# Patient Record
Sex: Male | Born: 1940 | Race: White | Hispanic: No | Marital: Married | Smoking: Never smoker
Health system: Southern US, Community
[De-identification: ages and names within clinical notes are randomized; demographics above are authoritative.]

## PROBLEM LIST (undated history)

## (undated) HISTORY — PX: HERNIA REPAIR: SHX51

---

## 2002-08-04 ENCOUNTER — Encounter (INDEPENDENT_AMBULATORY_CARE_PROVIDER_SITE_OTHER): Payer: Self-pay

## 2002-08-04 ENCOUNTER — Ambulatory Visit (HOSPITAL_COMMUNITY): Admission: RE | Admit: 2002-08-04 | Discharge: 2002-08-04 | Payer: Self-pay | Admitting: *Deleted

## 2006-07-12 ENCOUNTER — Encounter: Admission: RE | Admit: 2006-07-12 | Discharge: 2006-07-12 | Payer: Self-pay | Admitting: Internal Medicine

## 2006-07-12 IMAGING — US US SCROTUM
1 series · 13 of 25 positions shown · non-contrast
Comparison: none

CLINICAL DATA: Question left testicular mass on physical exam. 
 SCROTAL ULTRASOUND:
TECHNIQUE: Complete ultrasound examination of the testicles, epididymis, and other scrotal structures was performed.

[Series 1: us scrotum · 0.09mm/px · 13 of 57 slices shown]
[im 1/57]
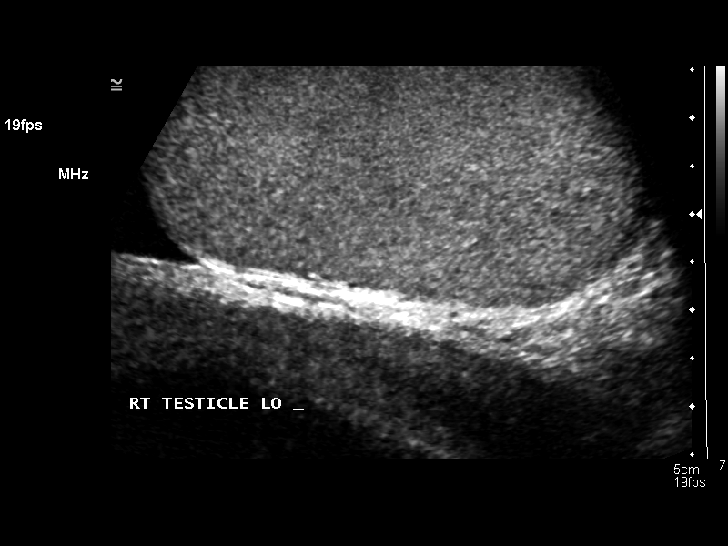
[im 5/57]
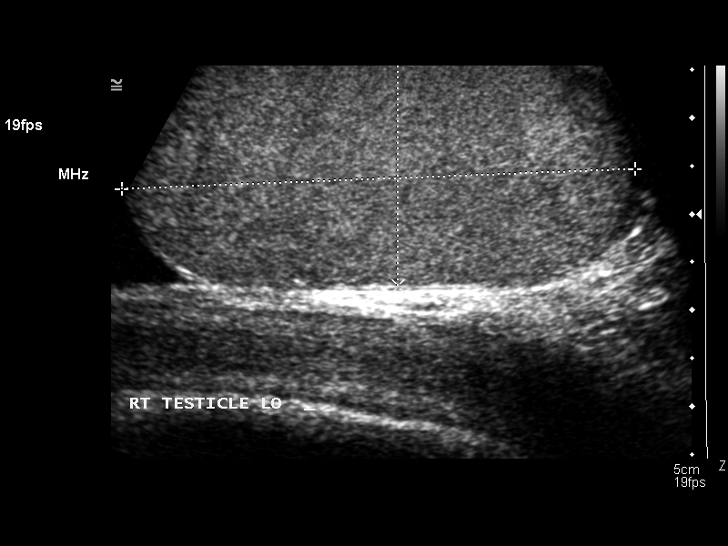
[im 10/57]
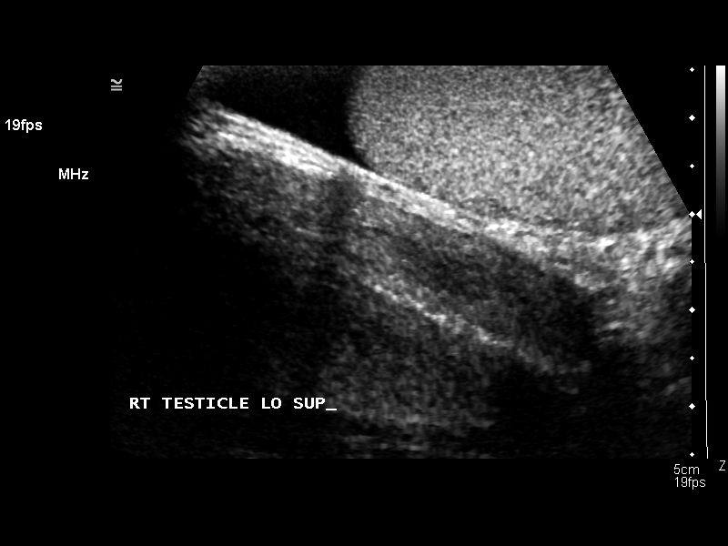
[im 15/57]
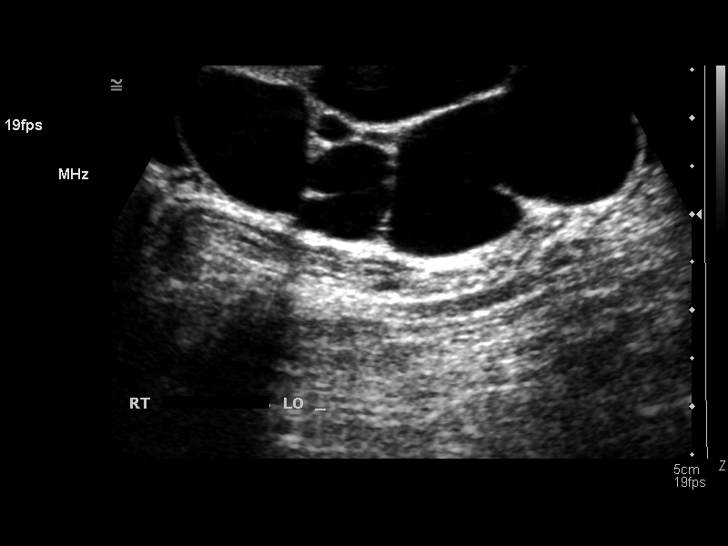
[im 19/57]
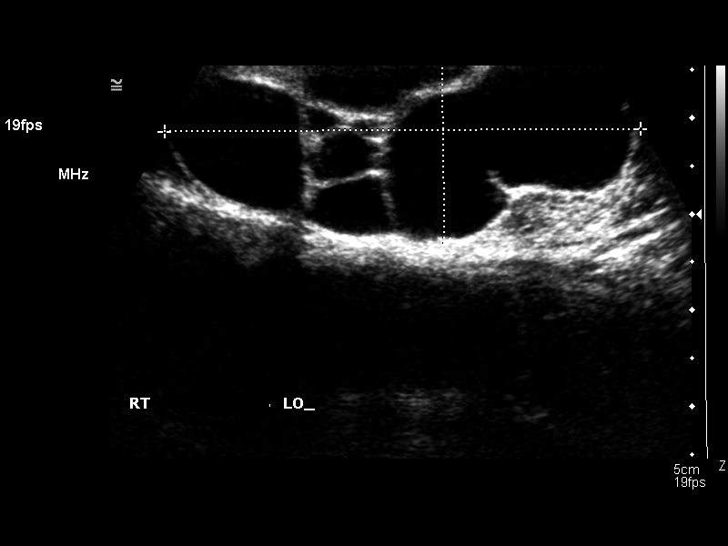
[im 24/57]
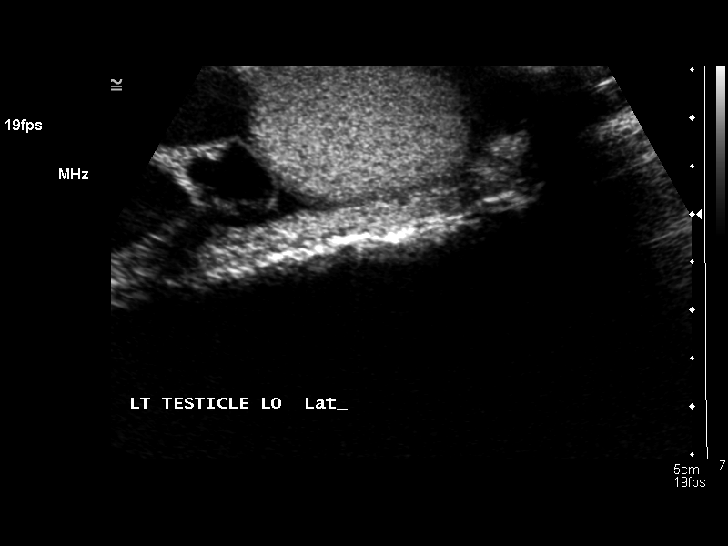
[im 29/57]
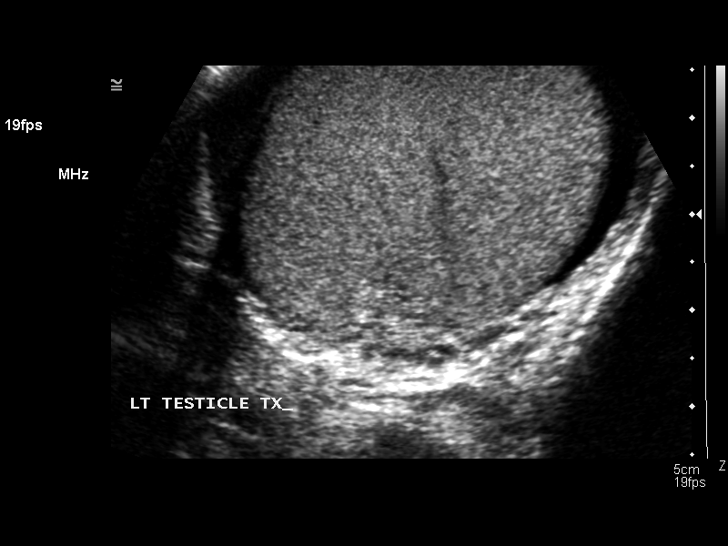
[im 33/57]
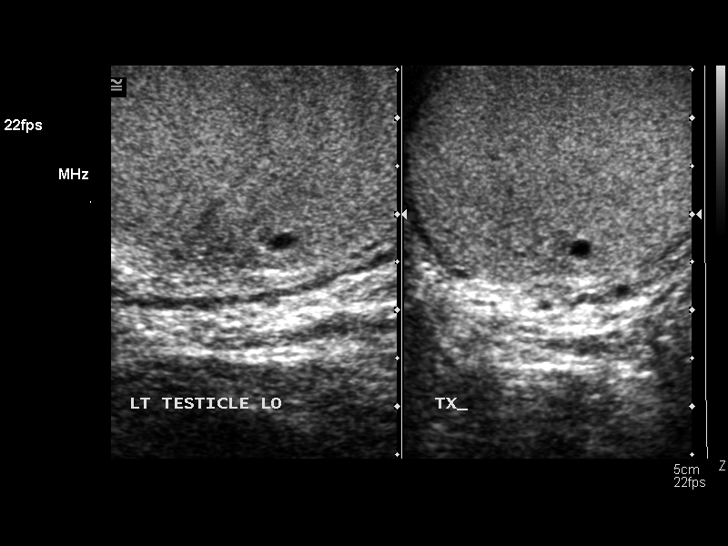
[im 38/57]
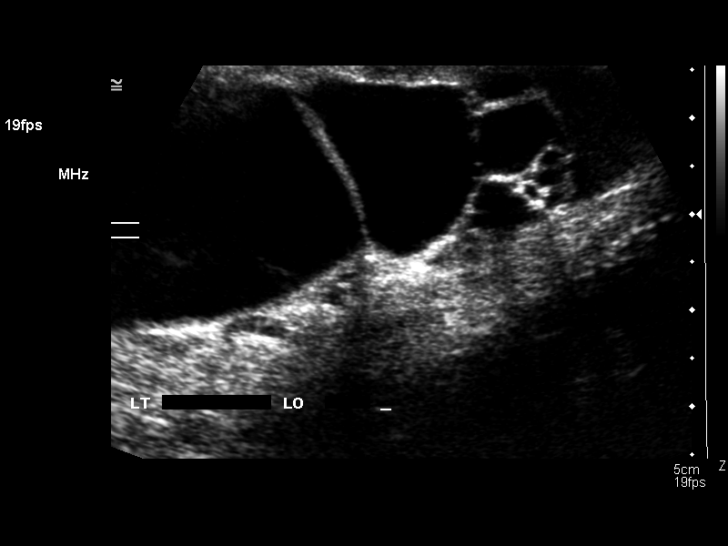
[im 43/57]
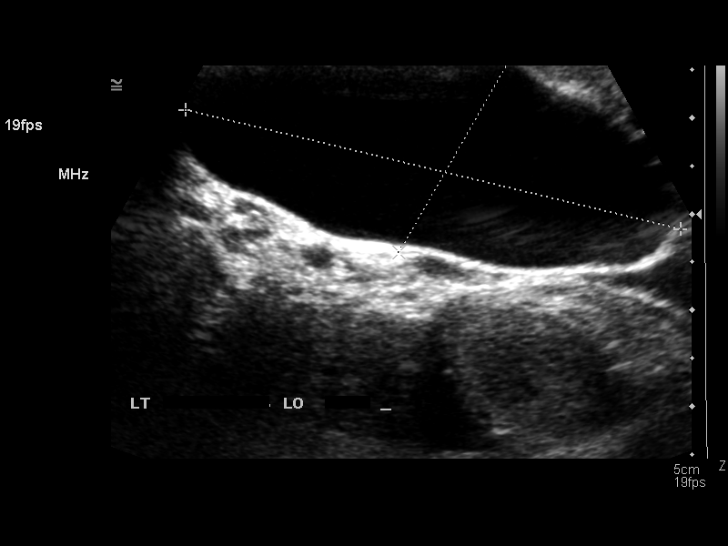
[im 47/57]
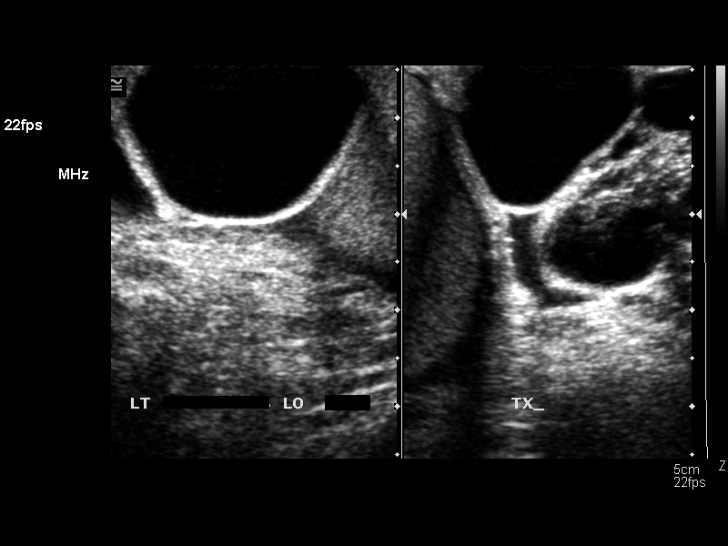
[im 52/57]
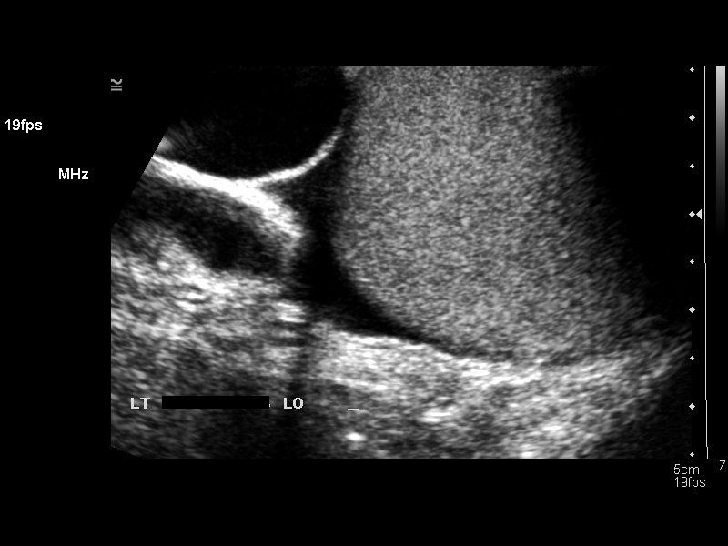
[im 57/57]
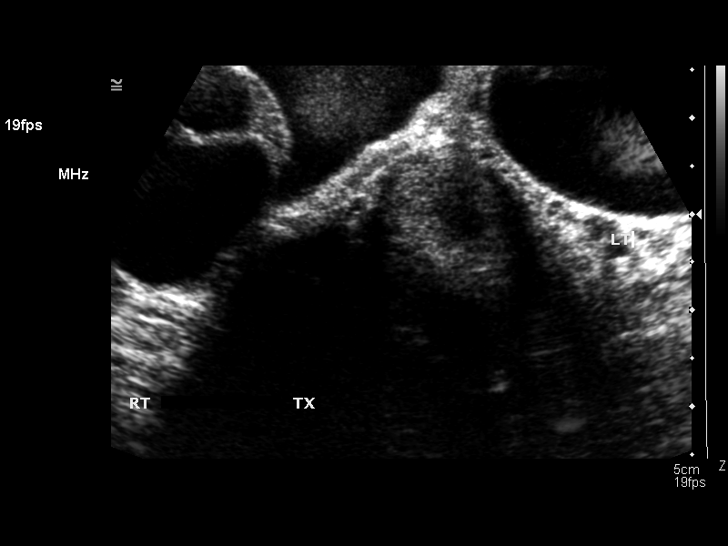

[13 of 25 positions shown; findings below may reference images not displayed]

FINDINGS: Bilateral testes are normal in size with the right measuring 5.3 cm long x 2.5 cm AP x 3.8 cm wide and the left 4.9 cm long x 2.3 cm AP x 3.7 cm wide with normal internal testicular color Doppler flow with arterial and venous wave forms.  At the posterior inferior left testis is benign-appearing cyst measuring 4 mm long x 2 mm AP x 2 mm wide.  Bilateral scrotal fluid is upper limits of normal with no significant hydroceles.  No varicocele seen.  Large benign-appearing multiloculated cysts/spermatoceles are seen at the bilateral epididymal heads the largest at the right entirety measuring 5 cm long x 2.1 cm AP x 2.8 cm wide with the largest single loculation at the left epididymal head measuring 5.3 cm long x 2.4 cm AP x 3.9 cm wide with adjacent smaller cystic locules measuring 2.6 x 1.8 x 1.9 and 3.8 x 2.2 x 2.2 cm.
IMPRESSION: 1.  4 mm benign-appearing left testicular cyst.
 2.  Prominent multicystic spermatoceles at the bilateral epididymal heads. 
 3.  Otherwise negative.

## 2008-09-29 ENCOUNTER — Encounter (INDEPENDENT_AMBULATORY_CARE_PROVIDER_SITE_OTHER): Payer: Self-pay | Admitting: *Deleted

## 2008-09-29 ENCOUNTER — Ambulatory Visit (HOSPITAL_COMMUNITY): Admission: RE | Admit: 2008-09-29 | Discharge: 2008-09-29 | Payer: Self-pay | Admitting: *Deleted

## 2010-11-01 NOTE — Op Note (Signed)
NAMEMYRAN, ARCIA NO.:  [id number]   MEDICAL RECORD NO.:  [id number]          PATIENT TYPE:  AMB   LOCATION:  ENDO                         FACILITY:  [facility]   PHYSICIAN:  Georgiana Spinner, M.D.    DATE OF BIRTH:  Jul 28, 1940   DATE OF PROCEDURE:  DATE OF DISCHARGE:                               OPERATIVE REPORT   PROCEDURE:  Upper endoscopy.   INDICATIONS:  GERD with history of Barrett's endoscopically, but not  biopsy proven.   ANESTHESIA:  Fentanyl 100 mcg, Versed 10 mg.   DESCRIPTION OF PROCEDURE:  With the patient mildly sedated in the left  lateral decubitus position, the Pentax videoscopic endoscope was  inserted in the mouth and passed under direct vision through the  esophagus which appeared normal until we reached the distal esophagus  and it could not be clearly seen since it would not open, but there were  areas that appeared adenomatous, so I elected to biopsy them.  We  entered into the stomach.  The fundus, body, antrum, duodenal bulb and  second portion of the duodenum were visualized.  From this point, the  endoscope was slowly withdrawn taking circumferential views of duodenal  mucosa until the endoscope had been pulled back into the stomach and  placed in retroflexion to view the stomach from below.  The endoscope  was straightened and withdrawn taking circumferential views remaining  gastric and esophageal mucosa.  The patient's vital signs and pulse  oximeter remained stable.  The patient tolerated the procedure well  without apparent complication.   FINDINGS:  Question of Barrett's esophagus, biopsied.  Await biopsy  report.  The patient will call me for results and follow-up with me as  an outpatient.  Proceed to colonoscopy as planned.           ______________________________  Georgiana Spinner, M.D.     GMO/MEDQ  D:  09/29/2008  T:  09/29/2008  Job:  [id number]

## 2010-11-01 NOTE — Op Note (Signed)
NAMEJAYLN, MADEIRA NO.:  [id number]   MEDICAL RECORD NO.:  [id number]          PATIENT TYPE:  AMB   LOCATION:  ENDO                         FACILITY:  [facility]   PHYSICIAN:  Georgiana Spinner, M.D.    DATE OF BIRTH:  Oct 30, 1940   DATE OF PROCEDURE:  DATE OF DISCHARGE:                               OPERATIVE REPORT   PROCEDURE:  Colonoscopy.   INDICATIONS:  Colon polyps.   ANESTHESIA:  None further given.   PROCEDURE:  With the patient mildly sedated in the left lateral  decubitus position, a rectal exam was performed which was unremarkable.  Subsequently, the Pentax videoscopic pediatric colonoscope was inserted  in the rectum and passed under direct vision with pressure applied to  reach the cecum, identified by ileocecal valve and appendiceal orifice,  both of which were photographed.  From this point, the colonoscope was  slowly withdrawn taking circumferential views of the colonic mucosa  stopping at the splenic flexure area where a polyp was seen,  photographed and removed using hot biopsy forceps technique on a setting  of 20/150 blended current.  We then withdrew all the way to the rectum  which appeared normal on direct and showed hemorrhoids on retroflexed  view.  The endoscope was straightened and withdrawn.  The patient's  vital signs and pulse oximeter remained stable.  The patient tolerated  the procedure well without apparent complications.   FINDINGS:  Internal hemorrhoids, polyp of splenic flexure.  Await biopsy  reports.  The patient will call me for results and follow-up with me as  an outpatient.           ______________________________  Georgiana Spinner, M.D.     GMO/MEDQ  D:  09/29/2008  T:  09/29/2008  Job:  [id number]

## 2010-11-04 NOTE — Op Note (Signed)
   NAME:  Jose Chapman, Jose Chapman                           ACCOUNT NO.:  [id number]   MEDICAL RECORD NO.:  [id number]                   PATIENT TYPE:  AMB   LOCATION:  ENDO                                 FACILITY:  [facility]   PHYSICIAN:  Georgiana Spinner, M.D.                 DATE OF BIRTH:  [birth year]   DATE OF PROCEDURE:  08/04/2002  DATE OF DISCHARGE:                                 OPERATIVE REPORT   PROCEDURE:  Colonoscopy.   INDICATIONS:  Rectal bleeding, probably due to hemorrhoids.   ANESTHESIA:  Demerol 25.   DESCRIPTION OF PROCEDURE:  With patient mildly sedated in the left lateral  decubitus position, the Olympus videoscopic colonoscope was inserted in the  rectum after normal rectal exam and passed under direct vision to the cecum,  identified by the ileocecal valve and appendiceal orifice, both of which  were photographed.  From this point, the colonoscope was slowly withdrawn,  taking circumferential views of the entire colonic mucosa, stopping only in  the rectum which appeared normal on direct and showed hemorrhoids on  retroflexed view.  The endoscope was straightened and withdrawn.  The  patient's vital signs and pulse oximeter remained stable.  The patient  tolerated the procedure well without apparent complications.   FINDINGS:  1. Internal hemorrhoids.  2. Otherwise, unremarkable exam.   PLAN:  1. See endoscopy note for further details.  2. Have patient follow up with me in five years or as needed.                                               Georgiana Spinner, M.D.    GMO/MEDQ  D:  08/04/2002  T:  08/04/2002  Job:  [id number]

## 2010-11-04 NOTE — Op Note (Signed)
   NAME:  Jose Chapman, Jose Chapman                           ACCOUNT NO.:  [id number]   MEDICAL RECORD NO.:  [id number]                   PATIENT TYPE:  AMB   LOCATION:  ENDO                                 FACILITY:  [facility]   PHYSICIAN:  Georgiana Spinner, M.D.                 DATE OF BIRTH:  15-Aug-1940   DATE OF PROCEDURE:  08/04/2002  DATE OF DISCHARGE:                                 OPERATIVE REPORT   PROCEDURE:  Upper endoscopy.   INDICATIONS:  GERD.   ANESTHESIA:  Demerol 50 mg, Versed 8 mg.   DESCRIPTION OF PROCEDURE:  With the patient mildly sedated in the left  lateral decubitus position, the Olympus videoscopic endoscope inserted in  the mouth, passed under direct vision through the esophagus, which appeared  grossly normal.  We entered into the stomach.  The fundus, body, antrum,  duodenal bulb, and second portion of the duodenum all appeared normal.  From  this point the endoscope was slowly withdrawn, taking circumferential views  of the entire duodenal mucosa until the endoscope had been pulled back into  the stomach, placed in retroflexion to view the stomach from below.  The  endoscope was then straightened and withdrawn, taking circumferential views  of the remaining gastric and esophageal mucosa, stopping only in the distal  esophagus above a hiatal hernia, where there appeared to be maybe a short  segment of Barrett's esophagus, but this may have been just inadequate  opening of the esophagus, but the area was biopsied.  The patient's vital  signs and pulse oximetry remained stable.  The patient tolerated the  procedure well and without apparent complications.   FINDINGS:  Question of short-segment Barrett's esophagus above a hiatal  hernia, biopsied.   PLAN:  Await biopsy report.  The patient will call me for results and follow  up with me as an outpatient.  Proceed to colonoscopy as planned.                                               Georgiana Spinner, M.D.    GMO/MEDQ   D:  08/04/2002  T:  08/04/2002  Job:  [id number]

## 2011-07-31 DIAGNOSIS — Z125 Encounter for screening for malignant neoplasm of prostate: Secondary | ICD-10-CM | POA: Diagnosis not present

## 2011-07-31 DIAGNOSIS — R5383 Other fatigue: Secondary | ICD-10-CM | POA: Diagnosis not present

## 2011-07-31 DIAGNOSIS — E7800 Pure hypercholesterolemia, unspecified: Secondary | ICD-10-CM | POA: Diagnosis not present

## 2011-07-31 DIAGNOSIS — R5381 Other malaise: Secondary | ICD-10-CM | POA: Diagnosis not present

## 2011-08-03 DIAGNOSIS — E7800 Pure hypercholesterolemia, unspecified: Secondary | ICD-10-CM | POA: Diagnosis not present

## 2011-08-03 DIAGNOSIS — R7989 Other specified abnormal findings of blood chemistry: Secondary | ICD-10-CM | POA: Diagnosis not present

## 2011-08-03 DIAGNOSIS — Z0000 Encounter for general adult medical examination without abnormal findings: Secondary | ICD-10-CM | POA: Diagnosis not present

## 2011-12-26 DIAGNOSIS — B353 Tinea pedis: Secondary | ICD-10-CM | POA: Diagnosis not present

## 2011-12-26 DIAGNOSIS — D239 Other benign neoplasm of skin, unspecified: Secondary | ICD-10-CM | POA: Diagnosis not present

## 2011-12-26 DIAGNOSIS — Z85828 Personal history of other malignant neoplasm of skin: Secondary | ICD-10-CM | POA: Diagnosis not present

## 2011-12-26 DIAGNOSIS — L821 Other seborrheic keratosis: Secondary | ICD-10-CM | POA: Diagnosis not present

## 2012-08-05 DIAGNOSIS — Z0000 Encounter for general adult medical examination without abnormal findings: Secondary | ICD-10-CM | POA: Diagnosis not present

## 2012-08-05 DIAGNOSIS — Z125 Encounter for screening for malignant neoplasm of prostate: Secondary | ICD-10-CM | POA: Diagnosis not present

## 2012-08-05 DIAGNOSIS — R7989 Other specified abnormal findings of blood chemistry: Secondary | ICD-10-CM | POA: Diagnosis not present

## 2012-08-05 DIAGNOSIS — E7800 Pure hypercholesterolemia, unspecified: Secondary | ICD-10-CM | POA: Diagnosis not present

## 2012-08-07 DIAGNOSIS — Z125 Encounter for screening for malignant neoplasm of prostate: Secondary | ICD-10-CM | POA: Diagnosis not present

## 2012-08-07 DIAGNOSIS — Z1331 Encounter for screening for depression: Secondary | ICD-10-CM | POA: Diagnosis not present

## 2013-01-02 DIAGNOSIS — L219 Seborrheic dermatitis, unspecified: Secondary | ICD-10-CM | POA: Diagnosis not present

## 2013-01-02 DIAGNOSIS — Z85828 Personal history of other malignant neoplasm of skin: Secondary | ICD-10-CM | POA: Diagnosis not present

## 2013-01-02 DIAGNOSIS — D239 Other benign neoplasm of skin, unspecified: Secondary | ICD-10-CM | POA: Diagnosis not present

## 2013-01-02 DIAGNOSIS — L570 Actinic keratosis: Secondary | ICD-10-CM | POA: Diagnosis not present

## 2013-06-13 DIAGNOSIS — R05 Cough: Secondary | ICD-10-CM | POA: Diagnosis not present

## 2013-06-13 DIAGNOSIS — J209 Acute bronchitis, unspecified: Secondary | ICD-10-CM | POA: Diagnosis not present

## 2013-09-05 DIAGNOSIS — E7800 Pure hypercholesterolemia, unspecified: Secondary | ICD-10-CM | POA: Diagnosis not present

## 2013-09-05 DIAGNOSIS — Z125 Encounter for screening for malignant neoplasm of prostate: Secondary | ICD-10-CM | POA: Diagnosis not present

## 2013-09-05 DIAGNOSIS — R7309 Other abnormal glucose: Secondary | ICD-10-CM | POA: Diagnosis not present

## 2013-09-11 DIAGNOSIS — Z0000 Encounter for general adult medical examination without abnormal findings: Secondary | ICD-10-CM | POA: Diagnosis not present

## 2013-09-11 DIAGNOSIS — Z1331 Encounter for screening for depression: Secondary | ICD-10-CM | POA: Diagnosis not present

## 2013-12-18 DIAGNOSIS — K2270 Barrett's esophagus without dysplasia: Secondary | ICD-10-CM | POA: Diagnosis not present

## 2013-12-18 DIAGNOSIS — Z86010 Personal history of colonic polyps: Secondary | ICD-10-CM | POA: Diagnosis not present

## 2013-12-18 DIAGNOSIS — Z09 Encounter for follow-up examination after completed treatment for conditions other than malignant neoplasm: Secondary | ICD-10-CM | POA: Diagnosis not present

## 2013-12-18 DIAGNOSIS — K449 Diaphragmatic hernia without obstruction or gangrene: Secondary | ICD-10-CM | POA: Diagnosis not present

## 2013-12-18 DIAGNOSIS — K222 Esophageal obstruction: Secondary | ICD-10-CM | POA: Diagnosis not present

## 2014-01-28 DIAGNOSIS — L570 Actinic keratosis: Secondary | ICD-10-CM | POA: Diagnosis not present

## 2014-01-28 DIAGNOSIS — I781 Nevus, non-neoplastic: Secondary | ICD-10-CM | POA: Diagnosis not present

## 2014-01-28 DIAGNOSIS — L219 Seborrheic dermatitis, unspecified: Secondary | ICD-10-CM | POA: Diagnosis not present

## 2014-01-28 DIAGNOSIS — D239 Other benign neoplasm of skin, unspecified: Secondary | ICD-10-CM | POA: Diagnosis not present

## 2014-01-28 DIAGNOSIS — Z85828 Personal history of other malignant neoplasm of skin: Secondary | ICD-10-CM | POA: Diagnosis not present

## 2014-01-28 DIAGNOSIS — D1779 Benign lipomatous neoplasm of other sites: Secondary | ICD-10-CM | POA: Diagnosis not present

## 2014-05-05 DIAGNOSIS — D2272 Melanocytic nevi of left lower limb, including hip: Secondary | ICD-10-CM | POA: Diagnosis not present

## 2014-09-17 DIAGNOSIS — Z125 Encounter for screening for malignant neoplasm of prostate: Secondary | ICD-10-CM | POA: Diagnosis not present

## 2014-09-17 DIAGNOSIS — E780 Pure hypercholesterolemia: Secondary | ICD-10-CM | POA: Diagnosis not present

## 2014-09-22 DIAGNOSIS — Z23 Encounter for immunization: Secondary | ICD-10-CM | POA: Diagnosis not present

## 2014-09-22 DIAGNOSIS — R7309 Other abnormal glucose: Secondary | ICD-10-CM | POA: Diagnosis not present

## 2014-09-22 DIAGNOSIS — Z0001 Encounter for general adult medical examination with abnormal findings: Secondary | ICD-10-CM | POA: Diagnosis not present

## 2015-03-02 DIAGNOSIS — D225 Melanocytic nevi of trunk: Secondary | ICD-10-CM | POA: Diagnosis not present

## 2015-03-02 DIAGNOSIS — D2272 Melanocytic nevi of left lower limb, including hip: Secondary | ICD-10-CM | POA: Diagnosis not present

## 2015-03-02 DIAGNOSIS — Z85828 Personal history of other malignant neoplasm of skin: Secondary | ICD-10-CM | POA: Diagnosis not present

## 2015-03-02 DIAGNOSIS — B353 Tinea pedis: Secondary | ICD-10-CM | POA: Diagnosis not present

## 2015-03-02 DIAGNOSIS — L219 Seborrheic dermatitis, unspecified: Secondary | ICD-10-CM | POA: Diagnosis not present

## 2015-03-02 DIAGNOSIS — L821 Other seborrheic keratosis: Secondary | ICD-10-CM | POA: Diagnosis not present

## 2015-03-02 DIAGNOSIS — D1721 Benign lipomatous neoplasm of skin and subcutaneous tissue of right arm: Secondary | ICD-10-CM | POA: Diagnosis not present

## 2015-03-02 DIAGNOSIS — L570 Actinic keratosis: Secondary | ICD-10-CM | POA: Diagnosis not present

## 2015-09-10 DIAGNOSIS — R0981 Nasal congestion: Secondary | ICD-10-CM | POA: Diagnosis not present

## 2015-09-10 DIAGNOSIS — J069 Acute upper respiratory infection, unspecified: Secondary | ICD-10-CM | POA: Diagnosis not present

## 2015-09-23 DIAGNOSIS — R7309 Other abnormal glucose: Secondary | ICD-10-CM | POA: Diagnosis not present

## 2015-09-23 DIAGNOSIS — Z125 Encounter for screening for malignant neoplasm of prostate: Secondary | ICD-10-CM | POA: Diagnosis not present

## 2015-09-23 DIAGNOSIS — Z0000 Encounter for general adult medical examination without abnormal findings: Secondary | ICD-10-CM | POA: Diagnosis not present

## 2015-09-23 DIAGNOSIS — Z0001 Encounter for general adult medical examination with abnormal findings: Secondary | ICD-10-CM | POA: Diagnosis not present

## 2015-09-23 DIAGNOSIS — Z7982 Long term (current) use of aspirin: Secondary | ICD-10-CM | POA: Diagnosis not present

## 2015-09-30 DIAGNOSIS — R7303 Prediabetes: Secondary | ICD-10-CM | POA: Diagnosis not present

## 2015-09-30 DIAGNOSIS — K649 Unspecified hemorrhoids: Secondary | ICD-10-CM | POA: Diagnosis not present

## 2015-09-30 DIAGNOSIS — G43109 Migraine with aura, not intractable, without status migrainosus: Secondary | ICD-10-CM | POA: Diagnosis not present

## 2015-09-30 DIAGNOSIS — Z85828 Personal history of other malignant neoplasm of skin: Secondary | ICD-10-CM | POA: Diagnosis not present

## 2016-03-02 DIAGNOSIS — D225 Melanocytic nevi of trunk: Secondary | ICD-10-CM | POA: Diagnosis not present

## 2016-03-02 DIAGNOSIS — L570 Actinic keratosis: Secondary | ICD-10-CM | POA: Diagnosis not present

## 2016-03-02 DIAGNOSIS — Z85828 Personal history of other malignant neoplasm of skin: Secondary | ICD-10-CM | POA: Diagnosis not present

## 2016-03-02 DIAGNOSIS — D2272 Melanocytic nevi of left lower limb, including hip: Secondary | ICD-10-CM | POA: Diagnosis not present

## 2016-03-02 DIAGNOSIS — L720 Epidermal cyst: Secondary | ICD-10-CM | POA: Diagnosis not present

## 2016-03-02 DIAGNOSIS — D1721 Benign lipomatous neoplasm of skin and subcutaneous tissue of right arm: Secondary | ICD-10-CM | POA: Diagnosis not present

## 2016-03-02 DIAGNOSIS — Z808 Family history of malignant neoplasm of other organs or systems: Secondary | ICD-10-CM | POA: Diagnosis not present

## 2016-03-13 DIAGNOSIS — Z6827 Body mass index (BMI) 27.0-27.9, adult: Secondary | ICD-10-CM | POA: Diagnosis not present

## 2016-03-13 DIAGNOSIS — M545 Low back pain: Secondary | ICD-10-CM | POA: Diagnosis not present

## 2016-03-13 DIAGNOSIS — Z23 Encounter for immunization: Secondary | ICD-10-CM | POA: Diagnosis not present

## 2016-03-13 DIAGNOSIS — M5126 Other intervertebral disc displacement, lumbar region: Secondary | ICD-10-CM | POA: Diagnosis not present

## 2016-05-16 DIAGNOSIS — M7712 Lateral epicondylitis, left elbow: Secondary | ICD-10-CM | POA: Diagnosis not present

## 2016-05-16 DIAGNOSIS — M25551 Pain in right hip: Secondary | ICD-10-CM | POA: Diagnosis not present

## 2016-05-22 ENCOUNTER — Ambulatory Visit
Admission: RE | Admit: 2016-05-22 | Discharge: 2016-05-22 | Disposition: A | Discharge status: Home / Self Care | Payer: Medicare Other | Source: Ambulatory Visit | Attending: Sports Medicine | Admitting: Sports Medicine

## 2016-05-22 ENCOUNTER — Encounter: Payer: Self-pay | Admitting: Sports Medicine

## 2016-05-22 ENCOUNTER — Ambulatory Visit (INDEPENDENT_AMBULATORY_CARE_PROVIDER_SITE_OTHER): Payer: Medicare Other | Admitting: Sports Medicine

## 2016-05-22 VITALS — BP 114/70 | Ht 74.0 in | Wt 205.0 lb

## 2016-05-22 DIAGNOSIS — M25551 Pain in right hip: Secondary | ICD-10-CM

## 2016-05-22 IMAGING — CR DG HIP (WITH OR WITHOUT PELVIS) 2-3V*R*
2 series · 2 of 2 positions shown · non-contrast
Comparison: None in PACs

CLINICAL DATA: Right hip pain for the past several weeks without
known injury

EXAM:
DG HIP (WITH OR WITHOUT PELVIS) 2-3V RIGHT

[w pelvis]
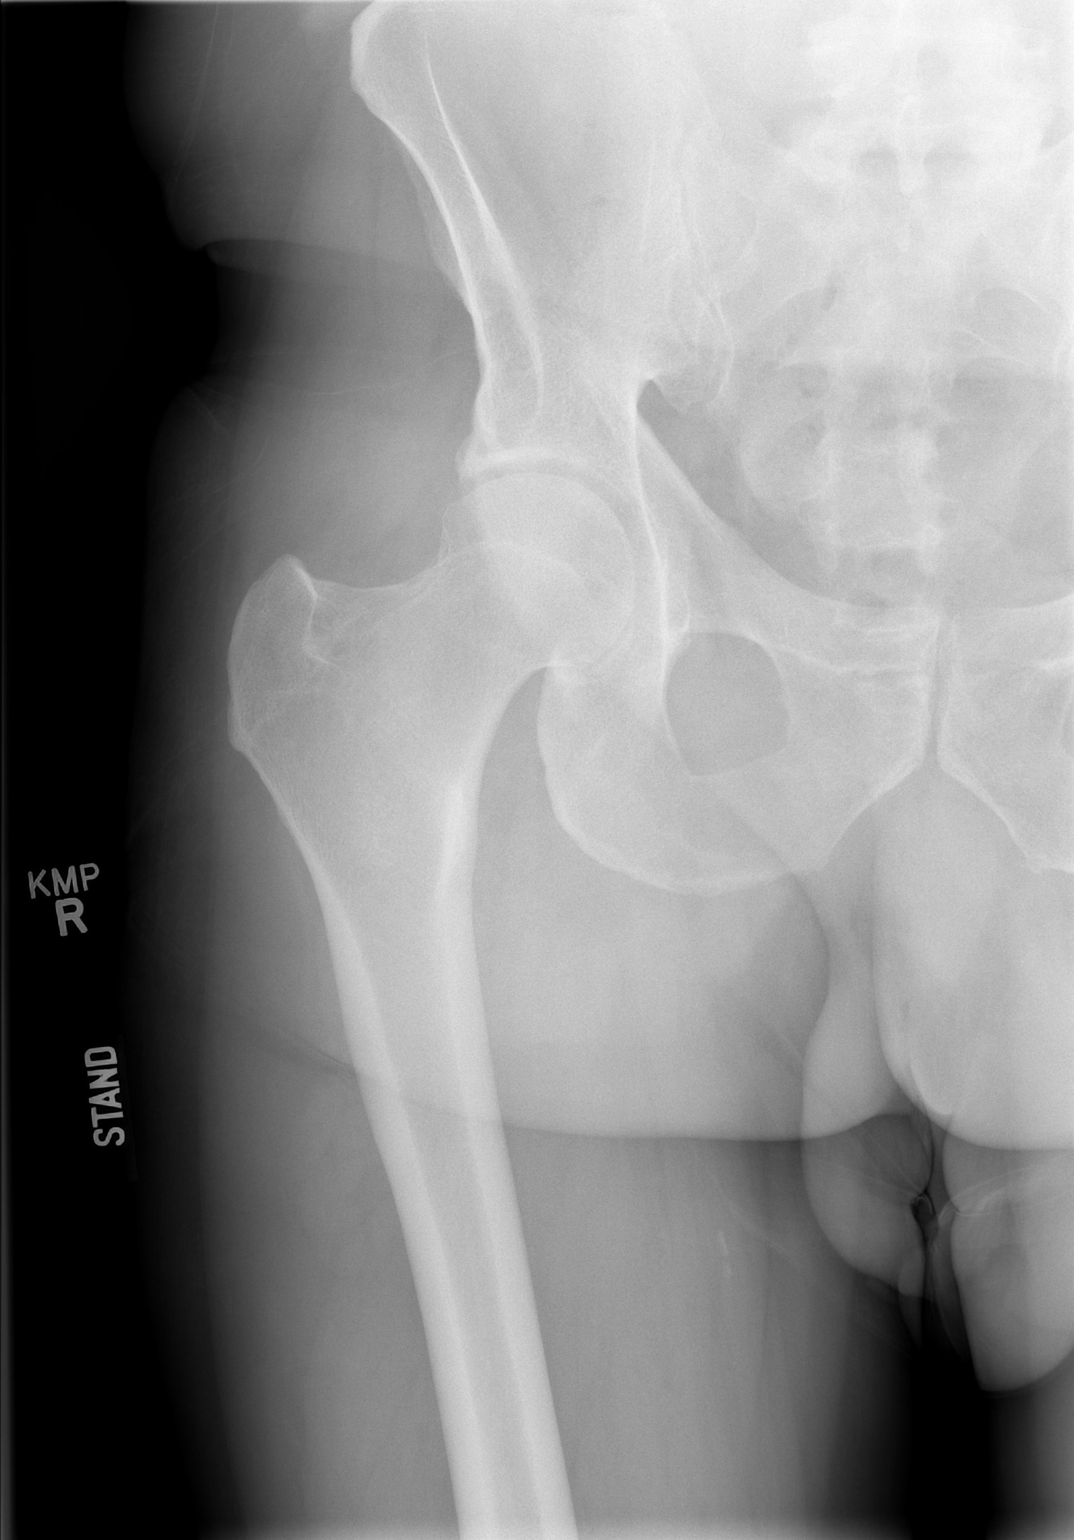

[t hip frog leg right]
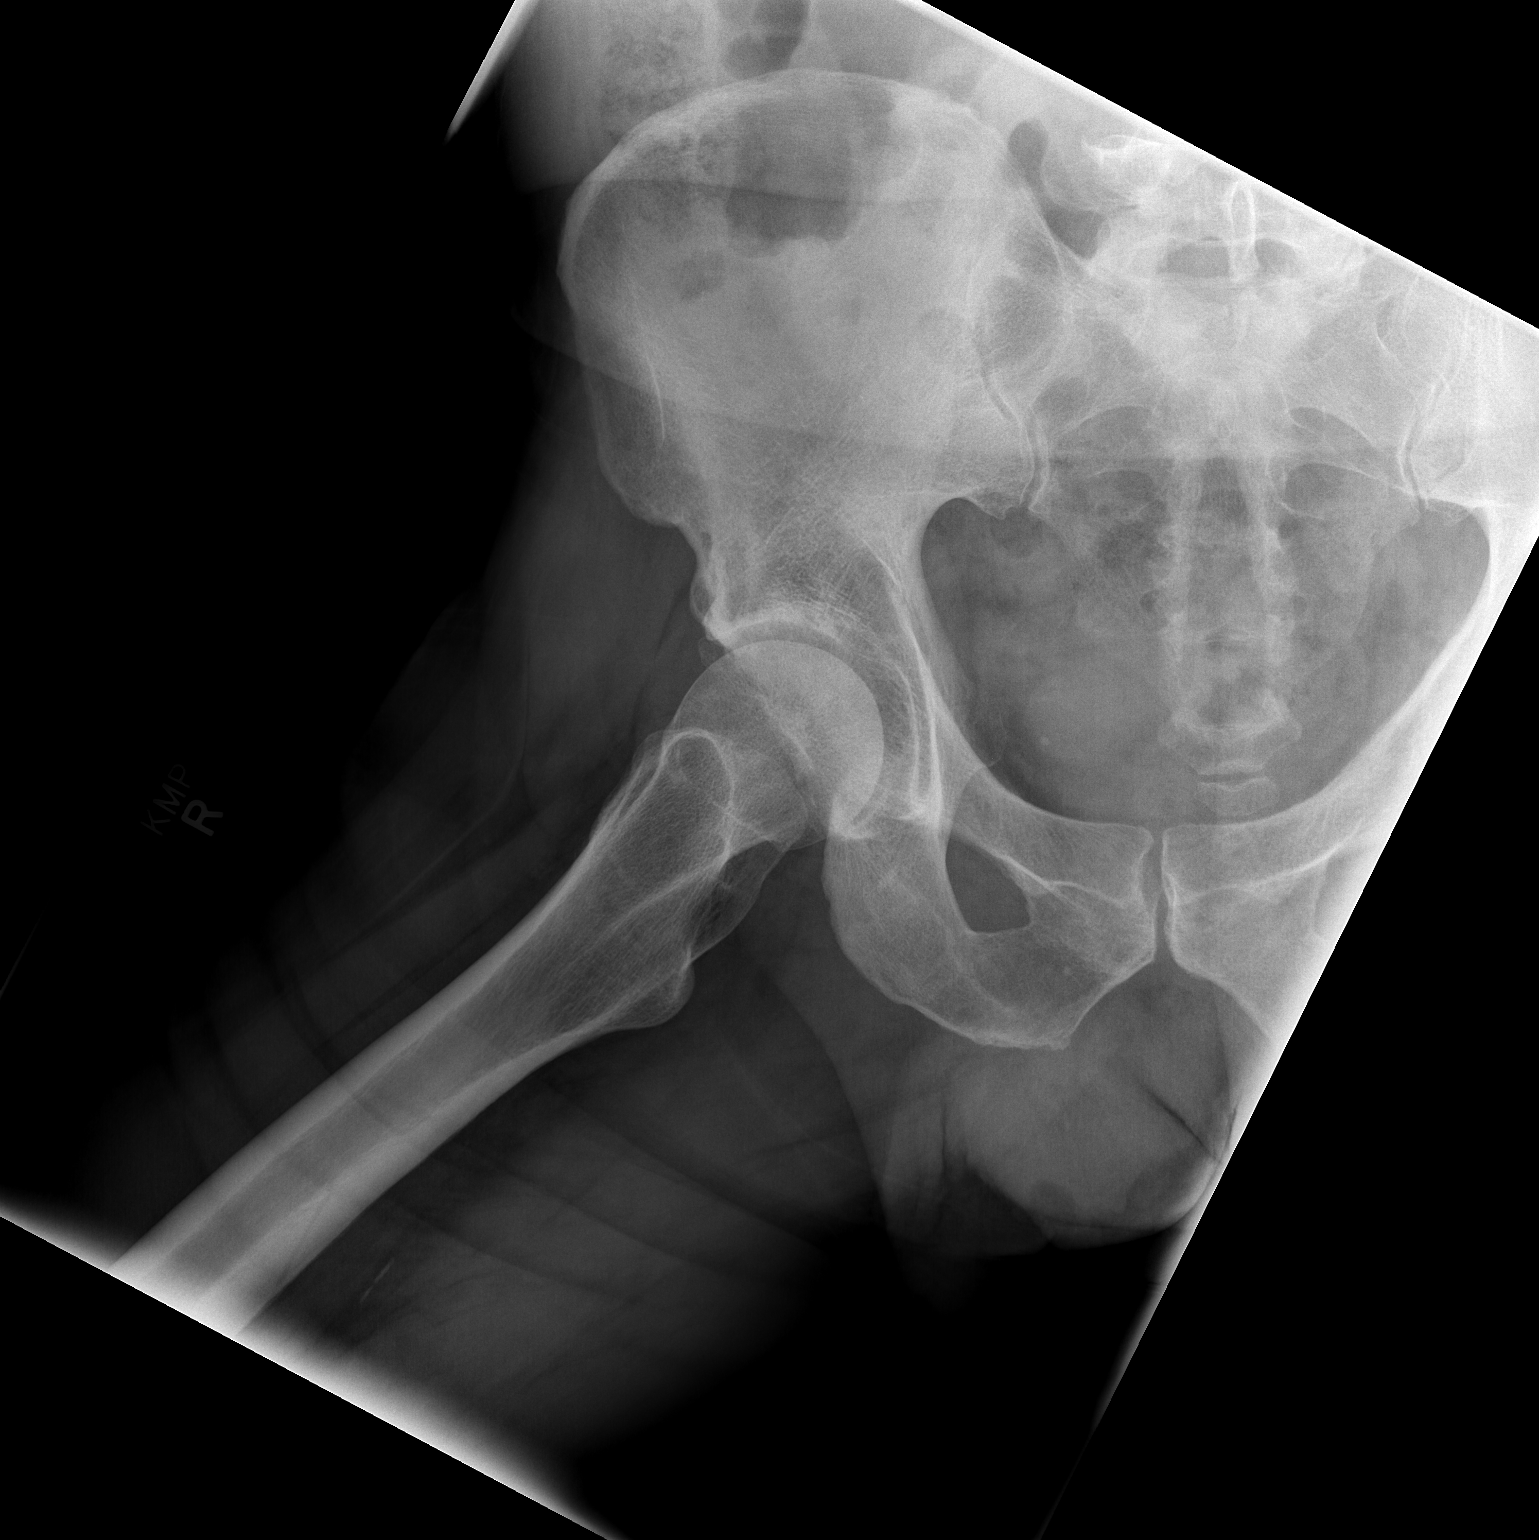

[2 of 2 positions shown; findings below may reference images not displayed]

FINDINGS: Deep the bones are subjectively adequately mineralized. The observed
portions of the right hemipelvis are normal. AP and lateral views of
the right hip reveal preservation of the joint space. The articular
surfaces of the femoral head and acetabulum remains smoothly
rounded. The femoral neck, intertrochanteric, and subtrochanteric
regions are normal.
IMPRESSION: There is no acute or significant chronic bony abnormality of the
right hip.

## 2016-05-22 MED ORDER — MELOXICAM 15 MG PO TABS: ORAL_TABLET | ORAL | 2 refills | Status: AC

## 2016-05-22 NOTE — Progress Notes (Signed)
   Subjective:    Patient ID: Jose Chapman, male    DOB: 1940/09/27, 75 y.o.   MRN: [id number]  HPI chief complaint: Right hip pain  Very pleasant 75 year old male comes in today at the request of Dr.Pharr for evaluation of his right hip. He does not recall any specific injury but rather describes a gradual onset of pain that he localizes to the lateral superior hip. His pain is present only with deep hip flexion. It is not present with walking. It does not wake him up from sleep. He denies any low back pain and no groin pain. He has taken an occasional Advil but he is not sure if it helps. He has had imaging of his lumbar spine which is normal per his report but has not had any imaging of his right hip. No prior hip surgeries. No right leg pain. No associated numbness or tingling.  Past medical history reviewed Medications reviewed Allergies reviewed    Review of Systems    as above Objective:   Physical Exam  Well-developed, well-nourished. No acute distress. Sitting comfortably in the exam room  Right hip: Smooth painless hip range of motion with a negative logroll. No tenderness to palpation over the greater trochanteric bursa. There is some slight tenderness to palpation over the area of the quadratus lumborum. He has reproducible pain that he localizes just superior to the iliac crest with right hip flexion. No tenderness over the SI joint. Neurovascularly intact distally.  X-rays of the right hip are obtained.They're unremarkable. No evidence of arthritis. 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 We discussed a referral for physical therapy if the anti-inflammatory medicine is not helpful. He is reassured that I see no evidence of arthritis on his x-rays. Follow-up for ongoing or recalcitrant issues.

## 2016-06-05 DIAGNOSIS — J209 Acute bronchitis, unspecified: Secondary | ICD-10-CM | POA: Diagnosis not present

## 2016-06-05 DIAGNOSIS — J069 Acute upper respiratory infection, unspecified: Secondary | ICD-10-CM | POA: Diagnosis not present

## 2017-01-01 DIAGNOSIS — Z125 Encounter for screening for malignant neoplasm of prostate: Secondary | ICD-10-CM | POA: Diagnosis not present

## 2017-01-01 DIAGNOSIS — R7303 Prediabetes: Secondary | ICD-10-CM | POA: Diagnosis not present

## 2017-01-01 DIAGNOSIS — Z0000 Encounter for general adult medical examination without abnormal findings: Secondary | ICD-10-CM | POA: Diagnosis not present

## 2017-01-01 DIAGNOSIS — Z7982 Long term (current) use of aspirin: Secondary | ICD-10-CM | POA: Diagnosis not present

## 2017-01-24 DIAGNOSIS — J309 Allergic rhinitis, unspecified: Secondary | ICD-10-CM | POA: Diagnosis not present

## 2017-01-24 DIAGNOSIS — Z7982 Long term (current) use of aspirin: Secondary | ICD-10-CM | POA: Diagnosis not present

## 2017-01-24 DIAGNOSIS — Z800 Family history of malignant neoplasm of digestive organs: Secondary | ICD-10-CM | POA: Diagnosis not present

## 2017-01-24 DIAGNOSIS — R109 Unspecified abdominal pain: Secondary | ICD-10-CM | POA: Diagnosis not present

## 2017-02-20 DIAGNOSIS — K429 Umbilical hernia without obstruction or gangrene: Secondary | ICD-10-CM | POA: Diagnosis not present

## 2017-03-09 DIAGNOSIS — M48061 Spinal stenosis, lumbar region without neurogenic claudication: Secondary | ICD-10-CM | POA: Diagnosis not present

## 2017-03-09 DIAGNOSIS — R1032 Left lower quadrant pain: Secondary | ICD-10-CM | POA: Diagnosis not present

## 2017-03-09 DIAGNOSIS — M545 Low back pain: Secondary | ICD-10-CM | POA: Diagnosis not present

## 2017-03-09 DIAGNOSIS — Z23 Encounter for immunization: Secondary | ICD-10-CM | POA: Diagnosis not present

## 2017-03-14 DIAGNOSIS — L570 Actinic keratosis: Secondary | ICD-10-CM | POA: Diagnosis not present

## 2017-03-14 DIAGNOSIS — D2272 Melanocytic nevi of left lower limb, including hip: Secondary | ICD-10-CM | POA: Diagnosis not present

## 2017-03-14 DIAGNOSIS — D225 Melanocytic nevi of trunk: Secondary | ICD-10-CM | POA: Diagnosis not present

## 2017-03-14 DIAGNOSIS — Z85828 Personal history of other malignant neoplasm of skin: Secondary | ICD-10-CM | POA: Diagnosis not present

## 2017-03-14 DIAGNOSIS — Z808 Family history of malignant neoplasm of other organs or systems: Secondary | ICD-10-CM | POA: Diagnosis not present

## 2017-03-14 DIAGNOSIS — D1721 Benign lipomatous neoplasm of skin and subcutaneous tissue of right arm: Secondary | ICD-10-CM | POA: Diagnosis not present

## 2017-03-14 DIAGNOSIS — Z23 Encounter for immunization: Secondary | ICD-10-CM | POA: Diagnosis not present

## 2017-03-20 DIAGNOSIS — M545 Low back pain: Secondary | ICD-10-CM | POA: Diagnosis not present

## 2017-03-23 DIAGNOSIS — M545 Low back pain: Secondary | ICD-10-CM | POA: Diagnosis not present

## 2017-03-26 DIAGNOSIS — M545 Low back pain: Secondary | ICD-10-CM | POA: Diagnosis not present

## 2017-03-29 DIAGNOSIS — M545 Low back pain: Secondary | ICD-10-CM | POA: Diagnosis not present

## 2017-04-03 DIAGNOSIS — K420 Umbilical hernia with obstruction, without gangrene: Secondary | ICD-10-CM | POA: Diagnosis not present

## 2017-04-23 DIAGNOSIS — H8110 Benign paroxysmal vertigo, unspecified ear: Secondary | ICD-10-CM | POA: Diagnosis not present

## 2017-05-30 DIAGNOSIS — M545 Low back pain: Secondary | ICD-10-CM | POA: Diagnosis not present

## 2017-06-04 DIAGNOSIS — M545 Low back pain: Secondary | ICD-10-CM | POA: Diagnosis not present

## 2017-06-06 DIAGNOSIS — M545 Low back pain: Secondary | ICD-10-CM | POA: Diagnosis not present

## 2017-06-13 DIAGNOSIS — M545 Low back pain: Secondary | ICD-10-CM | POA: Diagnosis not present

## 2017-08-09 DIAGNOSIS — J0100 Acute maxillary sinusitis, unspecified: Secondary | ICD-10-CM | POA: Diagnosis not present

## 2018-01-28 DIAGNOSIS — Z7982 Long term (current) use of aspirin: Secondary | ICD-10-CM | POA: Diagnosis not present

## 2018-01-28 DIAGNOSIS — Z125 Encounter for screening for malignant neoplasm of prostate: Secondary | ICD-10-CM | POA: Diagnosis not present

## 2018-01-28 DIAGNOSIS — R7303 Prediabetes: Secondary | ICD-10-CM | POA: Diagnosis not present

## 2018-01-28 DIAGNOSIS — Z79899 Other long term (current) drug therapy: Secondary | ICD-10-CM | POA: Diagnosis not present

## 2018-01-28 DIAGNOSIS — Z0001 Encounter for general adult medical examination with abnormal findings: Secondary | ICD-10-CM | POA: Diagnosis not present

## 2018-01-31 DIAGNOSIS — K649 Unspecified hemorrhoids: Secondary | ICD-10-CM | POA: Diagnosis not present

## 2018-01-31 DIAGNOSIS — Z6826 Body mass index (BMI) 26.0-26.9, adult: Secondary | ICD-10-CM | POA: Diagnosis not present

## 2018-01-31 DIAGNOSIS — Z800 Family history of malignant neoplasm of digestive organs: Secondary | ICD-10-CM | POA: Diagnosis not present

## 2018-01-31 DIAGNOSIS — Z85828 Personal history of other malignant neoplasm of skin: Secondary | ICD-10-CM | POA: Diagnosis not present

## 2018-01-31 DIAGNOSIS — G43109 Migraine with aura, not intractable, without status migrainosus: Secondary | ICD-10-CM | POA: Diagnosis not present

## 2018-01-31 DIAGNOSIS — Z7982 Long term (current) use of aspirin: Secondary | ICD-10-CM | POA: Diagnosis not present

## 2018-01-31 DIAGNOSIS — Z0000 Encounter for general adult medical examination without abnormal findings: Secondary | ICD-10-CM | POA: Diagnosis not present

## 2018-01-31 DIAGNOSIS — J309 Allergic rhinitis, unspecified: Secondary | ICD-10-CM | POA: Diagnosis not present

## 2018-01-31 DIAGNOSIS — R7303 Prediabetes: Secondary | ICD-10-CM | POA: Diagnosis not present

## 2018-01-31 DIAGNOSIS — B351 Tinea unguium: Secondary | ICD-10-CM | POA: Diagnosis not present

## 2018-03-20 DIAGNOSIS — Z23 Encounter for immunization: Secondary | ICD-10-CM | POA: Diagnosis not present

## 2018-03-20 DIAGNOSIS — B359 Dermatophytosis, unspecified: Secondary | ICD-10-CM | POA: Diagnosis not present

## 2018-03-20 DIAGNOSIS — B353 Tinea pedis: Secondary | ICD-10-CM | POA: Diagnosis not present

## 2018-03-20 DIAGNOSIS — L821 Other seborrheic keratosis: Secondary | ICD-10-CM | POA: Diagnosis not present

## 2018-03-20 DIAGNOSIS — L570 Actinic keratosis: Secondary | ICD-10-CM | POA: Diagnosis not present

## 2018-03-20 DIAGNOSIS — D225 Melanocytic nevi of trunk: Secondary | ICD-10-CM | POA: Diagnosis not present

## 2018-05-08 ENCOUNTER — Other Ambulatory Visit: Payer: Self-pay

## 2019-02-03 DIAGNOSIS — Z7982 Long term (current) use of aspirin: Secondary | ICD-10-CM | POA: Diagnosis not present

## 2019-02-03 DIAGNOSIS — Z125 Encounter for screening for malignant neoplasm of prostate: Secondary | ICD-10-CM | POA: Diagnosis not present

## 2019-02-03 DIAGNOSIS — R7303 Prediabetes: Secondary | ICD-10-CM | POA: Diagnosis not present

## 2019-02-07 DIAGNOSIS — G43109 Migraine with aura, not intractable, without status migrainosus: Secondary | ICD-10-CM | POA: Diagnosis not present

## 2019-02-07 DIAGNOSIS — R7303 Prediabetes: Secondary | ICD-10-CM | POA: Diagnosis not present

## 2019-02-07 DIAGNOSIS — Z85828 Personal history of other malignant neoplasm of skin: Secondary | ICD-10-CM | POA: Diagnosis not present

## 2019-02-07 DIAGNOSIS — K649 Unspecified hemorrhoids: Secondary | ICD-10-CM | POA: Diagnosis not present

## 2019-02-07 DIAGNOSIS — J309 Allergic rhinitis, unspecified: Secondary | ICD-10-CM | POA: Diagnosis not present

## 2019-02-07 DIAGNOSIS — Z125 Encounter for screening for malignant neoplasm of prostate: Secondary | ICD-10-CM | POA: Diagnosis not present

## 2019-02-07 DIAGNOSIS — Z0000 Encounter for general adult medical examination without abnormal findings: Secondary | ICD-10-CM | POA: Diagnosis not present

## 2019-02-07 DIAGNOSIS — Z800 Family history of malignant neoplasm of digestive organs: Secondary | ICD-10-CM | POA: Diagnosis not present

## 2019-03-21 DIAGNOSIS — D225 Melanocytic nevi of trunk: Secondary | ICD-10-CM | POA: Diagnosis not present

## 2019-03-21 DIAGNOSIS — L821 Other seborrheic keratosis: Secondary | ICD-10-CM | POA: Diagnosis not present

## 2019-03-21 DIAGNOSIS — D2272 Melanocytic nevi of left lower limb, including hip: Secondary | ICD-10-CM | POA: Diagnosis not present

## 2019-03-21 DIAGNOSIS — Z85828 Personal history of other malignant neoplasm of skin: Secondary | ICD-10-CM | POA: Diagnosis not present

## 2019-03-21 DIAGNOSIS — L570 Actinic keratosis: Secondary | ICD-10-CM | POA: Diagnosis not present

## 2019-03-21 DIAGNOSIS — D1730 Benign lipomatous neoplasm of skin and subcutaneous tissue of unspecified sites: Secondary | ICD-10-CM | POA: Diagnosis not present

## 2019-03-21 DIAGNOSIS — Z23 Encounter for immunization: Secondary | ICD-10-CM | POA: Diagnosis not present

## 2019-03-21 DIAGNOSIS — B353 Tinea pedis: Secondary | ICD-10-CM | POA: Diagnosis not present

## 2019-03-25 DIAGNOSIS — Z86010 Personal history of colonic polyps: Secondary | ICD-10-CM | POA: Diagnosis not present

## 2019-03-25 DIAGNOSIS — K219 Gastro-esophageal reflux disease without esophagitis: Secondary | ICD-10-CM | POA: Diagnosis not present

## 2019-04-29 DIAGNOSIS — Z1159 Encounter for screening for other viral diseases: Secondary | ICD-10-CM | POA: Diagnosis not present

## 2019-05-02 DIAGNOSIS — K648 Other hemorrhoids: Secondary | ICD-10-CM | POA: Diagnosis not present

## 2019-05-02 DIAGNOSIS — Z800 Family history of malignant neoplasm of digestive organs: Secondary | ICD-10-CM | POA: Diagnosis not present

## 2019-05-02 DIAGNOSIS — K5730 Diverticulosis of large intestine without perforation or abscess without bleeding: Secondary | ICD-10-CM | POA: Diagnosis not present

## 2019-05-02 DIAGNOSIS — Z86010 Personal history of colonic polyps: Secondary | ICD-10-CM | POA: Diagnosis not present

## 2020-03-08 DIAGNOSIS — R7303 Prediabetes: Secondary | ICD-10-CM | POA: Diagnosis not present

## 2020-03-08 DIAGNOSIS — Z7982 Long term (current) use of aspirin: Secondary | ICD-10-CM | POA: Diagnosis not present

## 2020-03-08 DIAGNOSIS — Z125 Encounter for screening for malignant neoplasm of prostate: Secondary | ICD-10-CM | POA: Diagnosis not present

## 2020-04-13 DIAGNOSIS — L578 Other skin changes due to chronic exposure to nonionizing radiation: Secondary | ICD-10-CM | POA: Diagnosis not present

## 2020-04-13 DIAGNOSIS — Z85828 Personal history of other malignant neoplasm of skin: Secondary | ICD-10-CM | POA: Diagnosis not present

## 2020-04-13 DIAGNOSIS — D225 Melanocytic nevi of trunk: Secondary | ICD-10-CM | POA: Diagnosis not present

## 2020-04-13 DIAGNOSIS — L821 Other seborrheic keratosis: Secondary | ICD-10-CM | POA: Diagnosis not present

## 2020-04-13 DIAGNOSIS — D2272 Melanocytic nevi of left lower limb, including hip: Secondary | ICD-10-CM | POA: Diagnosis not present

## 2020-04-13 DIAGNOSIS — L570 Actinic keratosis: Secondary | ICD-10-CM | POA: Diagnosis not present

## 2020-04-13 DIAGNOSIS — B359 Dermatophytosis, unspecified: Secondary | ICD-10-CM | POA: Diagnosis not present

## 2020-04-13 DIAGNOSIS — D1730 Benign lipomatous neoplasm of skin and subcutaneous tissue of unspecified sites: Secondary | ICD-10-CM | POA: Diagnosis not present

## 2020-05-07 DIAGNOSIS — R3915 Urgency of urination: Secondary | ICD-10-CM | POA: Diagnosis not present

## 2020-05-07 DIAGNOSIS — R809 Proteinuria, unspecified: Secondary | ICD-10-CM | POA: Diagnosis not present

## 2020-05-07 DIAGNOSIS — R300 Dysuria: Secondary | ICD-10-CM | POA: Diagnosis not present

## 2020-05-07 DIAGNOSIS — N390 Urinary tract infection, site not specified: Secondary | ICD-10-CM | POA: Diagnosis not present

## 2020-05-07 DIAGNOSIS — N3091 Cystitis, unspecified with hematuria: Secondary | ICD-10-CM | POA: Diagnosis not present

## 2020-06-24 DIAGNOSIS — Z125 Encounter for screening for malignant neoplasm of prostate: Secondary | ICD-10-CM | POA: Diagnosis not present

## 2020-06-24 DIAGNOSIS — Z7982 Long term (current) use of aspirin: Secondary | ICD-10-CM | POA: Diagnosis not present

## 2020-06-24 DIAGNOSIS — R7303 Prediabetes: Secondary | ICD-10-CM | POA: Diagnosis not present

## 2020-07-01 DIAGNOSIS — R972 Elevated prostate specific antigen [PSA]: Secondary | ICD-10-CM | POA: Diagnosis not present

## 2020-07-01 DIAGNOSIS — Z0000 Encounter for general adult medical examination without abnormal findings: Secondary | ICD-10-CM | POA: Diagnosis not present

## 2020-07-01 DIAGNOSIS — R7303 Prediabetes: Secondary | ICD-10-CM | POA: Diagnosis not present

## 2020-07-01 DIAGNOSIS — Z800 Family history of malignant neoplasm of digestive organs: Secondary | ICD-10-CM | POA: Diagnosis not present

## 2020-07-01 DIAGNOSIS — J309 Allergic rhinitis, unspecified: Secondary | ICD-10-CM | POA: Diagnosis not present

## 2020-07-15 ENCOUNTER — Other Ambulatory Visit: Payer: Self-pay | Admitting: Internal Medicine

## 2020-07-15 DIAGNOSIS — R7303 Prediabetes: Secondary | ICD-10-CM

## 2020-07-26 DIAGNOSIS — J0100 Acute maxillary sinusitis, unspecified: Secondary | ICD-10-CM | POA: Diagnosis not present

## 2020-07-26 DIAGNOSIS — J209 Acute bronchitis, unspecified: Secondary | ICD-10-CM | POA: Diagnosis not present

## 2020-07-29 ENCOUNTER — Other Ambulatory Visit: Payer: Medicare Other

## 2020-08-13 DIAGNOSIS — N3000 Acute cystitis without hematuria: Secondary | ICD-10-CM | POA: Diagnosis not present

## 2020-08-13 DIAGNOSIS — N400 Enlarged prostate without lower urinary tract symptoms: Secondary | ICD-10-CM | POA: Diagnosis not present

## 2021-04-19 DIAGNOSIS — D1730 Benign lipomatous neoplasm of skin and subcutaneous tissue of unspecified sites: Secondary | ICD-10-CM | POA: Diagnosis not present

## 2021-04-19 DIAGNOSIS — D485 Neoplasm of uncertain behavior of skin: Secondary | ICD-10-CM | POA: Diagnosis not present

## 2021-04-19 DIAGNOSIS — Z85828 Personal history of other malignant neoplasm of skin: Secondary | ICD-10-CM | POA: Diagnosis not present

## 2021-04-19 DIAGNOSIS — D0462 Carcinoma in situ of skin of left upper limb, including shoulder: Secondary | ICD-10-CM | POA: Diagnosis not present

## 2021-04-19 DIAGNOSIS — D2272 Melanocytic nevi of left lower limb, including hip: Secondary | ICD-10-CM | POA: Diagnosis not present

## 2021-04-19 DIAGNOSIS — L570 Actinic keratosis: Secondary | ICD-10-CM | POA: Diagnosis not present

## 2021-04-19 DIAGNOSIS — L578 Other skin changes due to chronic exposure to nonionizing radiation: Secondary | ICD-10-CM | POA: Diagnosis not present

## 2021-04-19 DIAGNOSIS — D225 Melanocytic nevi of trunk: Secondary | ICD-10-CM | POA: Diagnosis not present

## 2021-04-19 DIAGNOSIS — L821 Other seborrheic keratosis: Secondary | ICD-10-CM | POA: Diagnosis not present

## 2021-05-18 DIAGNOSIS — D045 Carcinoma in situ of skin of trunk: Secondary | ICD-10-CM | POA: Diagnosis not present

## 2021-05-25 ENCOUNTER — Emergency Department (HOSPITAL_COMMUNITY): Payer: Medicare Other

## 2021-05-25 ENCOUNTER — Encounter (HOSPITAL_COMMUNITY): Payer: Self-pay | Admitting: Oncology

## 2021-05-25 ENCOUNTER — Other Ambulatory Visit: Payer: Self-pay

## 2021-05-25 ENCOUNTER — Emergency Department (HOSPITAL_COMMUNITY)
Admission: EM | Admit: 2021-05-25 | Discharge: 2021-05-25 | Disposition: A | Discharge status: Home / Self Care | Payer: Medicare Other | Attending: Emergency Medicine | Admitting: Emergency Medicine

## 2021-05-25 DIAGNOSIS — H4921 Sixth [abducent] nerve palsy, right eye: Secondary | ICD-10-CM

## 2021-05-25 DIAGNOSIS — Z79899 Other long term (current) drug therapy: Secondary | ICD-10-CM | POA: Insufficient documentation

## 2021-05-25 DIAGNOSIS — R519 Headache, unspecified: Secondary | ICD-10-CM | POA: Diagnosis not present

## 2021-05-25 DIAGNOSIS — Z20822 Contact with and (suspected) exposure to covid-19: Secondary | ICD-10-CM | POA: Diagnosis not present

## 2021-05-25 DIAGNOSIS — R9431 Abnormal electrocardiogram [ECG] [EKG]: Secondary | ICD-10-CM | POA: Diagnosis not present

## 2021-05-25 DIAGNOSIS — H532 Diplopia: Secondary | ICD-10-CM | POA: Diagnosis not present

## 2021-05-25 LAB — CBC
HCT: 45.2 % (ref 39.0–52.0)
Hemoglobin: 15.5 g/dL (ref 13.0–17.0)
MCH: 30 pg (ref 26.0–34.0)
MCHC: 34.3 g/dL (ref 30.0–36.0)
MCV: 87.6 fL (ref 80.0–100.0)
Platelets: 299 10*3/uL (ref 150–400)
RBC: 5.16 MIL/uL (ref 4.22–5.81)
RDW: 13.8 % (ref 11.5–15.5)
WBC: 6.2 10*3/uL (ref 4.0–10.5)
nRBC: 0 % (ref 0.0–0.2)

## 2021-05-25 LAB — COMPREHENSIVE METABOLIC PANEL
ALT: 14 U/L (ref 0–44)
AST: 16 U/L (ref 15–41)
Albumin: 4.2 g/dL (ref 3.5–5.0)
Alkaline Phosphatase: 41 U/L (ref 38–126)
Anion gap: 6 (ref 5–15)
BUN: 15 mg/dL (ref 8–23)
CO2: 26 mmol/L (ref 22–32)
Calcium: 9 mg/dL (ref 8.9–10.3)
Chloride: 107 mmol/L (ref 98–111)
Creatinine, Ser: 0.96 mg/dL (ref 0.61–1.24)
GFR, Estimated: >60 mL/min (ref >60)
Glucose, Bld: 97 mg/dL (ref 70–99)
Potassium: 4 mmol/L (ref 3.5–5.1)
Sodium: 139 mmol/L (ref 135–145)
Total Bilirubin: 1.3 mg/dL — ABNORMAL HIGH (ref 0.3–1.2)
Total Protein: 7.3 g/dL (ref 6.5–8.1)

## 2021-05-25 LAB — RAPID URINE DRUG SCREEN, HOSP PERFORMED
Amphetamines: NOT DETECTED
Barbiturates: NOT DETECTED
Benzodiazepines: NOT DETECTED
Cocaine: NOT DETECTED
Opiates: NOT DETECTED
Tetrahydrocannabinol: NOT DETECTED

## 2021-05-25 LAB — URINALYSIS, ROUTINE W REFLEX MICROSCOPIC
Bacteria, UA: NONE SEEN
Bilirubin Urine: NEGATIVE
Glucose, UA: NEGATIVE mg/dL
Ketones, ur: NEGATIVE mg/dL
Leukocytes,Ua: NEGATIVE
Nitrite: NEGATIVE
Protein, ur: NEGATIVE mg/dL
Specific Gravity, Urine: 1.01 (ref 1.005–1.030)
pH: 6 (ref 5.0–8.0)

## 2021-05-25 LAB — DIFFERENTIAL
Abs Immature Granulocytes: 0.02 10*3/uL (ref 0.00–0.07)
Basophils Absolute: 0.1 10*3/uL (ref 0.0–0.1)
Basophils Relative: 1 %
Eosinophils Absolute: 0.2 10*3/uL (ref 0.0–0.5)
Eosinophils Relative: 3 %
Immature Granulocytes: 0 %
Lymphocytes Relative: 38 %
Lymphs Abs: 2.4 10*3/uL (ref 0.7–4.0)
Monocytes Absolute: 0.6 10*3/uL (ref 0.1–1.0)
Monocytes Relative: 9 %
Neutro Abs: 3 10*3/uL (ref 1.7–7.7)
Neutrophils Relative %: 49 %

## 2021-05-25 LAB — ETHANOL: Alcohol, Ethyl (B): <10 mg/dL (ref <10)

## 2021-05-25 LAB — RESP PANEL BY RT-PCR (FLU A&B, COVID) ARPGX2
Influenza A by PCR: NEGATIVE
Influenza B by PCR: NEGATIVE
SARS Coronavirus 2 by RT PCR: NEGATIVE

## 2021-05-25 LAB — C-REACTIVE PROTEIN: CRP: <0.5 mg/dL (ref <1.0)

## 2021-05-25 LAB — APTT: aPTT: 31 seconds (ref 24–36)

## 2021-05-25 LAB — SEDIMENTATION RATE: Sed Rate: 3 mm/hr (ref 0–16)

## 2021-05-25 LAB — PROTIME-INR
INR: 1 (ref 0.8–1.2)
Prothrombin Time: 13.4 seconds (ref 11.4–15.2)

## 2021-05-25 IMAGING — MR MR MRA HEAD W/O CM
3 of 4 series · 17 of 48 positions shown · non-contrast
Comparison: No pertinent prior exam.

CLINICAL DATA: Diplopia

EXAM:
MRA HEAD WITHOUT CONTRAST
TECHNIQUE: Angiographic images of the Circle of Willis were acquired using MRA
technique without intravenous contrast.

[Series 100: full tilt · axial · 0.35mm/px · 1 of 1 slices shown]
[im 1/1]
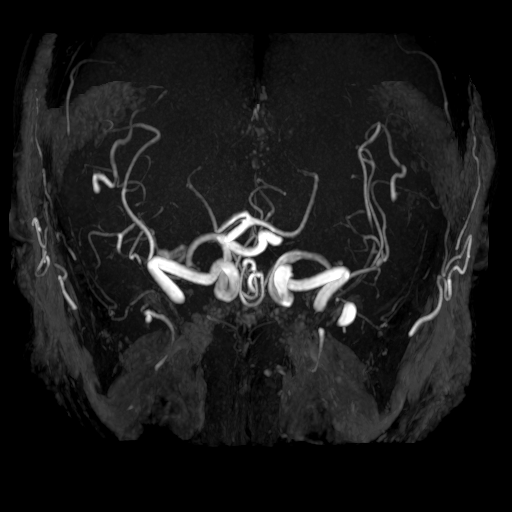

[Series 106: cor mpr · coronal · 0.6mm · 0.35mm/px · 8 of 41 slices shown]
[im 1/41]
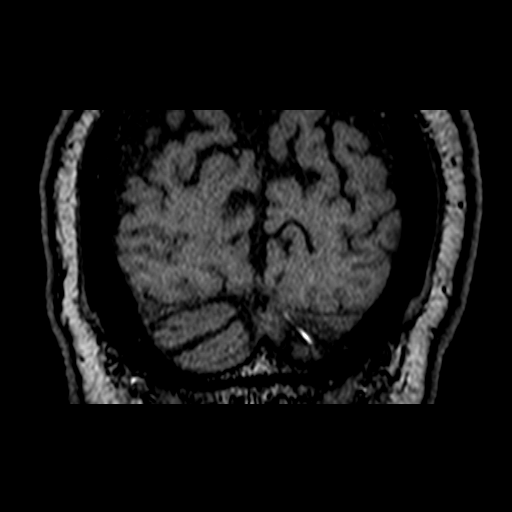
[im 6/41]
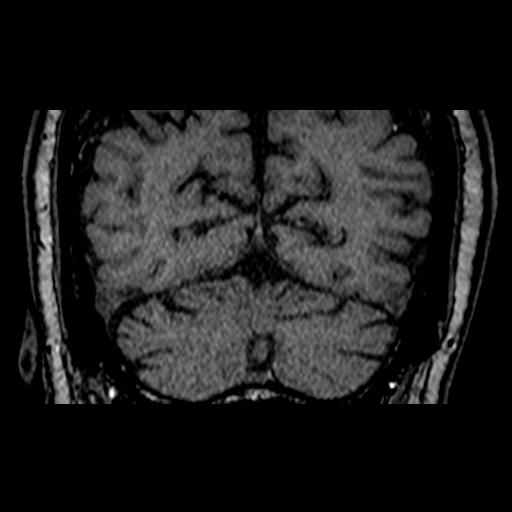
[im 12/41]
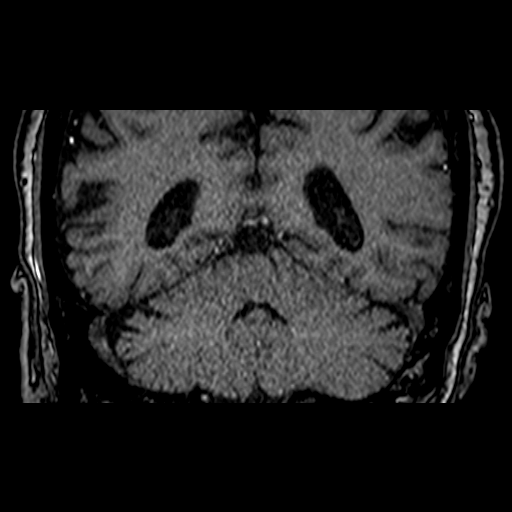
[im 18/41]
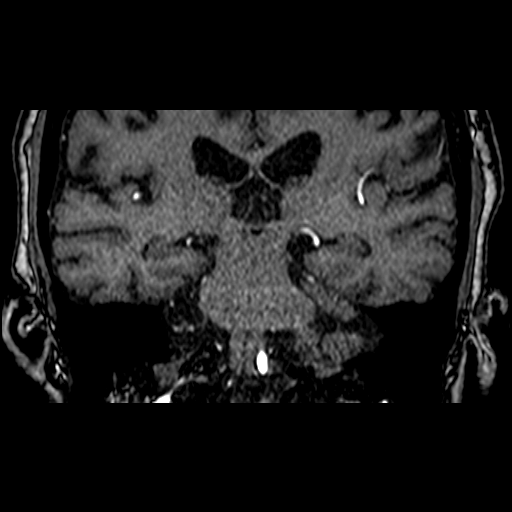
[im 23/41]
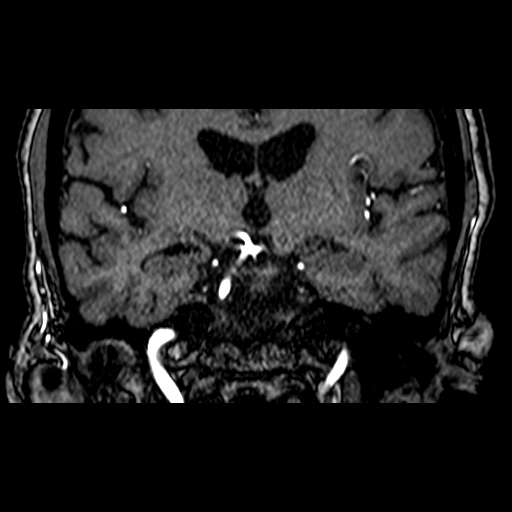
[im 29/41]
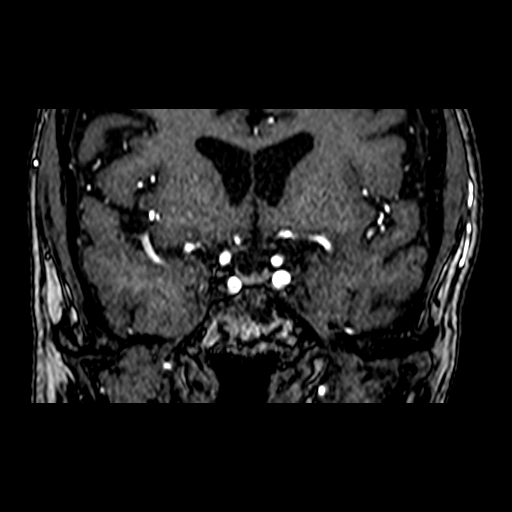
[im 35/41]
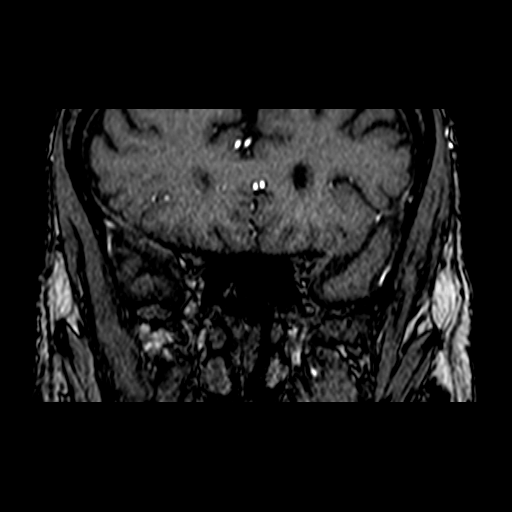
[im 41/41]
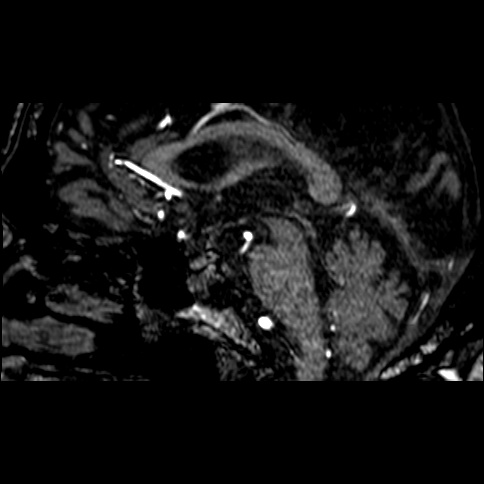

[Series 107: sag mpr · axial · 0.6mm · 0.37mm/px · z∈[+54,+120]mm · 8 of 42 slices shown]
[im 1/42]
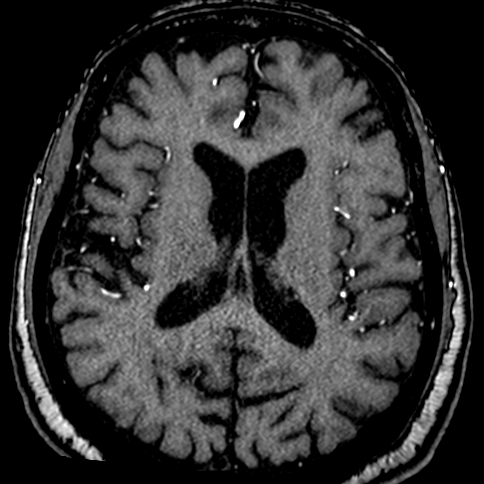
[im 6/42]
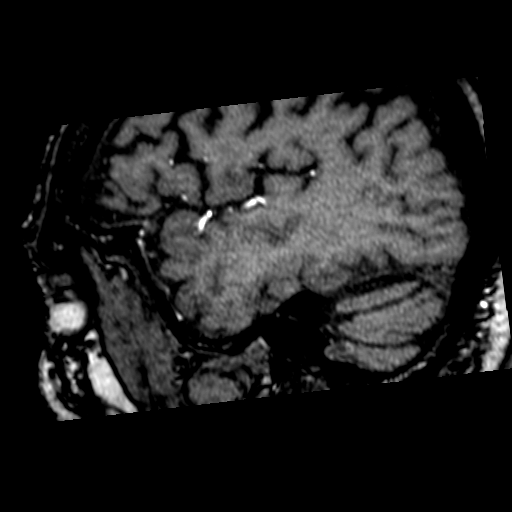
[im 12/42]
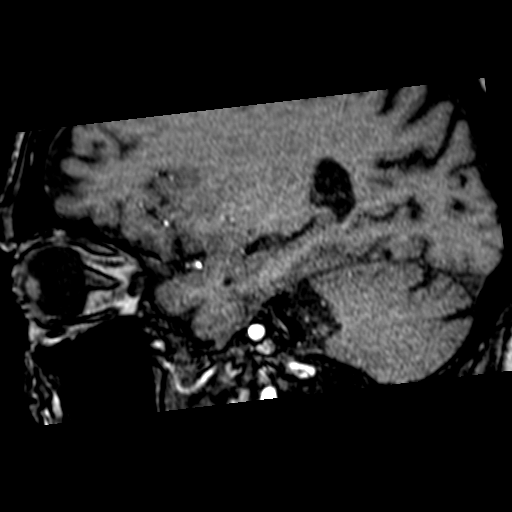
[im 18/42]
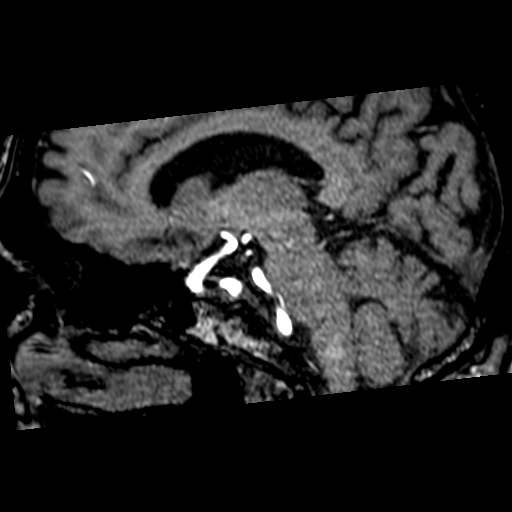
[im 24/42]
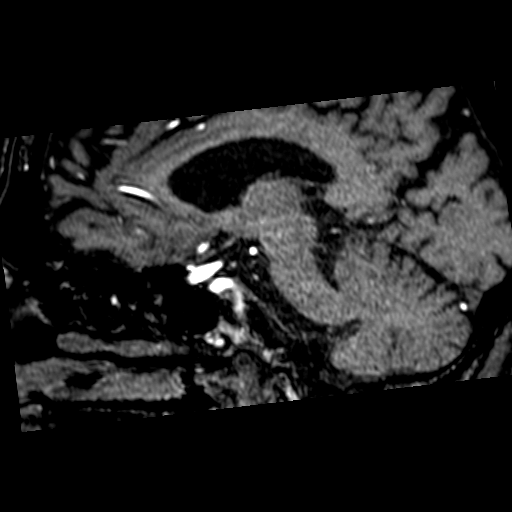
[im 30/42]
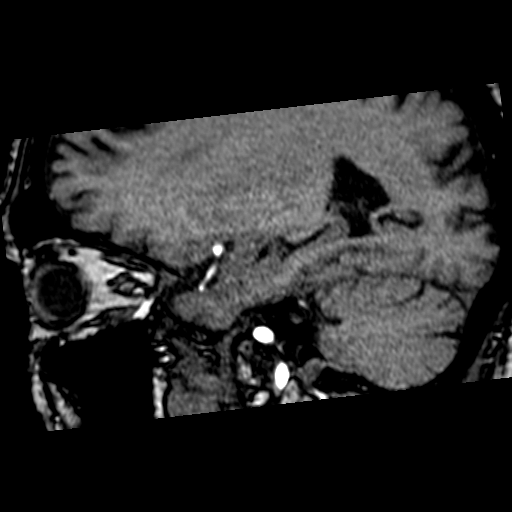
[im 36/42]
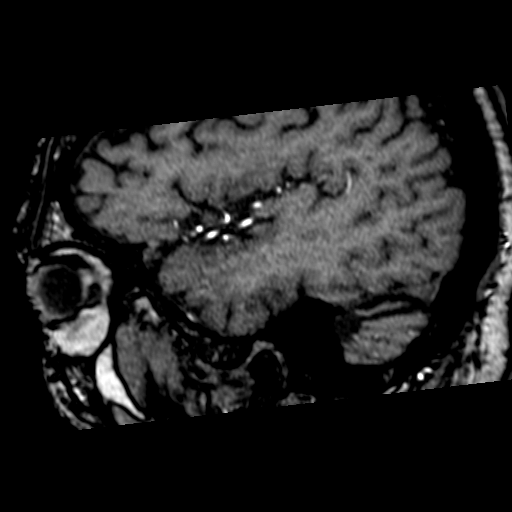
[im 42/42]
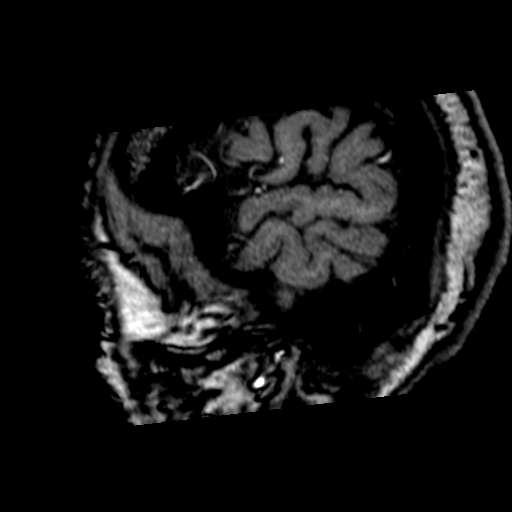

[17 of 48 positions shown; findings below may reference images not displayed]

FINDINGS: Anterior circulation: Both internal carotid arteries are patent to
the termini, without stenosis or other abnormality. A1 segments
patent. Normal anterior communicating artery. Anterior cerebral
arteries are patent to their distal aspects. No M1 stenosis or
occlusion. Normal MCA bifurcations. Distal MCA branches perfused and
symmetric.

Posterior circulation: Diminutive right vertebral artery. Vertebral
arteries patent to the vertebrobasilar junction without stenosis.
Posterior inferior cerebral arteries patent bilaterally. Basilar
patent to its distal aspect. Superior cerebellar arteries patent
bilaterally. PCAs perfused to their distal aspects without
stenosis.

Anatomic variants: None significant.

Other: None.
IMPRESSION: Normal MRA of the head.

## 2021-05-25 IMAGING — MR MR HEAD WO/W CM
15 series · 48 of 48 positions shown · IV contrast (gadavist)
Comparison: No prior MRI, correlation is made with [DATE] CT
head

CLINICAL DATA: Severe headache yesterday with visual changes. Left
eye blurriness. Right eye diplopia, history of ocular migraines.

EXAM:
MRI HEAD AND ORBITS WITHOUT AND WITH CONTRAST
TECHNIQUE: Multiplanar, multiecho pulse sequences of the brain and surrounding
structures were obtained without and with intravenous contrast.
Multiplanar, multiecho pulse sequences of the orbits and surrounding
structures were obtained including fat saturation techniques, before
and after intravenous contrast administration.
CONTRAST:  10mL GADAVIST GADOBUTROL 1 MMOL/ML IV SOLN

[Series 1: DWI · axial · 3.0mm · 1.36mm/px · z∈[-14,+125]mm · 5 of 96 slices shown (1 of 2)]
[im 1/96]
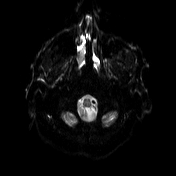
[im 24/96]
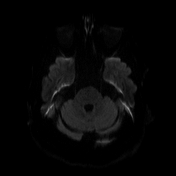
[im 48/96]
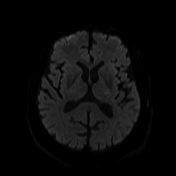
[im 72/96]
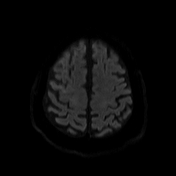
[im 96/96]
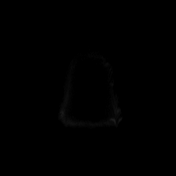

[Series 2: DWI · axial · 3.0mm · 1.36mm/px · z∈[-14,+125]mm · 3 of 48 slices shown (2 of 2)]
[im 1/48]
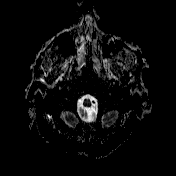
[im 24/48]
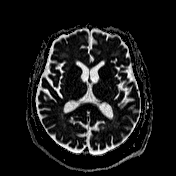
[im 48/48]
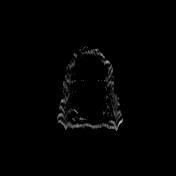

[Series 3: T1 · sagittal · 5.0mm · 0.75mm/px · 1 of 24 slices shown (1 of 4)]
[im 1/24]
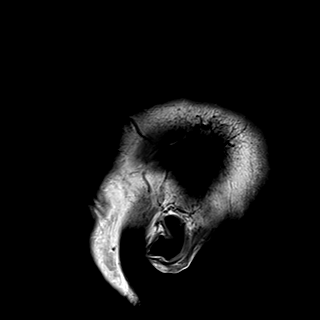

[Series 4: T2 · axial · 5.0mm · 0.62mm/px · 1 of 26 slices shown (1 of 2)]
[im 1/26]
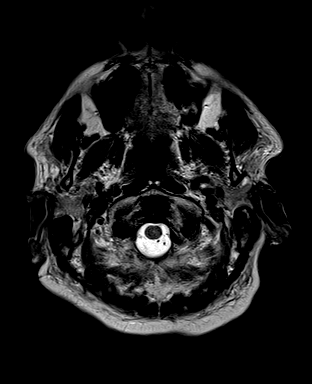

[Series 5: swi_images · axial · 3.0mm · 0.75mm/px · z∈[-32,+142]mm · 3 of 60 slices shown]
[im 1/60]
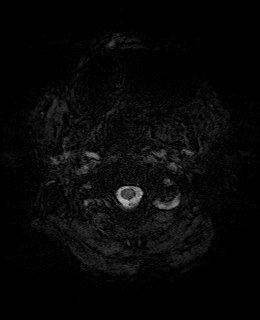
[im 30/60]
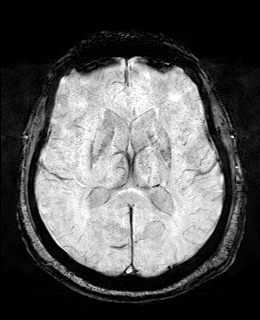
[im 60/60]
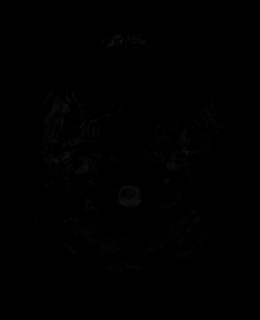

[Series 7: FLAIR · axial · 3.0mm · 0.75mm/px · z∈[-20,+130]mm · 3 of 52 slices shown]
[im 1/52]
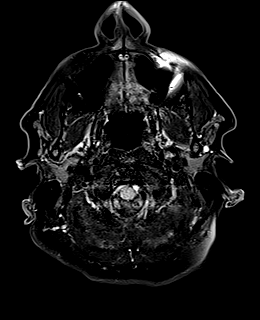
[im 26/52]
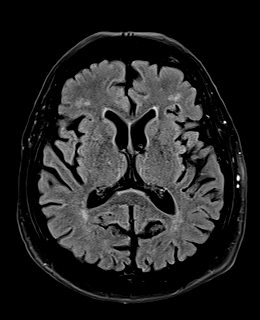
[im 52/52]
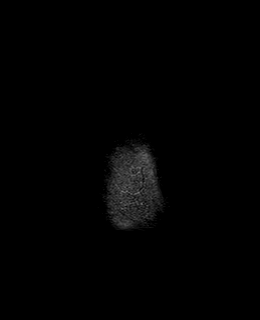

[Series 8: T1 · axial · 1.0mm · 0.94mm/px · z∈[-15,+125]mm · 8 of 144 slices shown (2 of 4)]
[im 1/144]
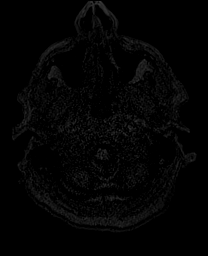
[im 21/144]
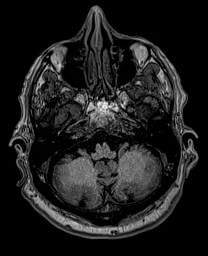
[im 41/144]
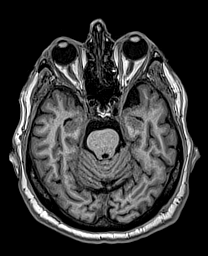
[im 62/144]
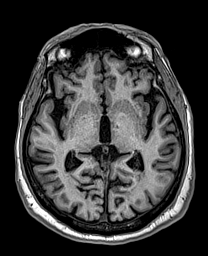
[im 82/144]
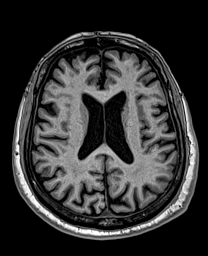
[im 103/144]
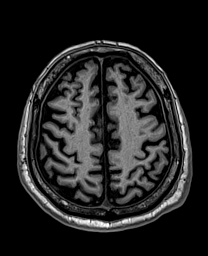
[im 123/144]
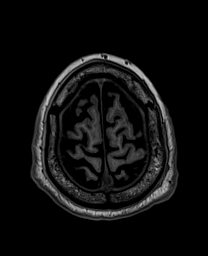
[im 144/144]
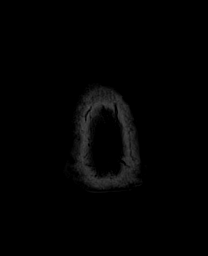

[Series 9: cor dwi_tracew · coronal · 5.0mm · 1.53mm/px · 3 of 60 slices shown]
[im 1/60]
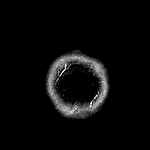
[im 30/60]
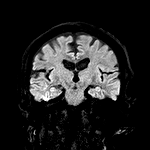
[im 60/60]
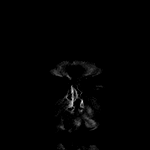

[Series 10: cor dwi_adc · coronal · 5.0mm · 1.53mm/px · 2 of 30 slices shown]
[im 1/30]
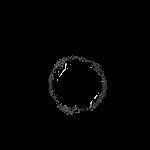
[im 30/30]
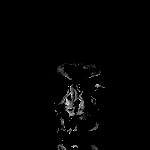

[Series 11: T2 · coronal · 5.0mm · 0.57mm/px · 2 of 30 slices shown (2 of 2)]
[im 1/30]
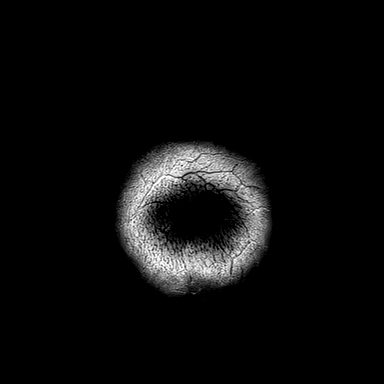
[im 30/30]
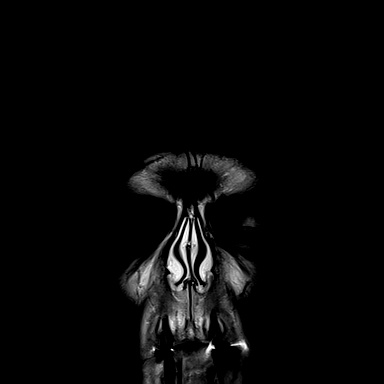

[Series 15: T1 post-contrast · coronal · 5.0mm · 0.43mm/px · 2 of 30 slices shown (1 of 3)]
[im 1/30]
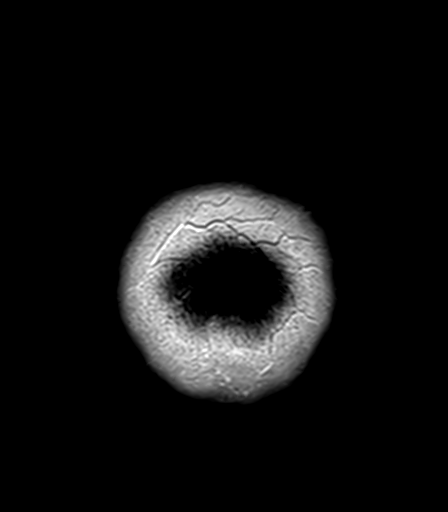
[im 30/30]
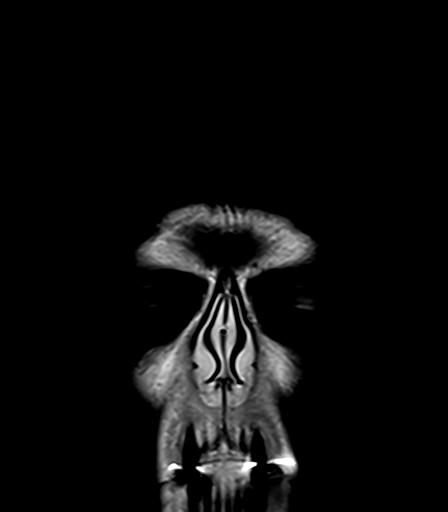

[Series 16: T1 post-contrast · sagittal · 5.0mm · 0.75mm/px · 1 of 24 slices shown (2 of 3)]
[im 1/24]
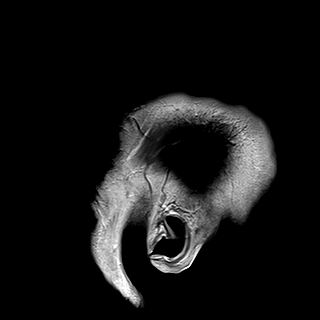

[Series 17: T1 post-contrast · axial · 1.0mm · 0.94mm/px · z∈[-25,+132]mm · 9 of 160 slices shown (3 of 3)]
[im 1/160]
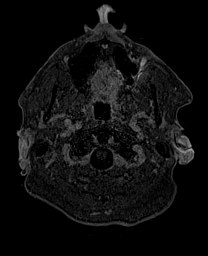
[im 20/160]
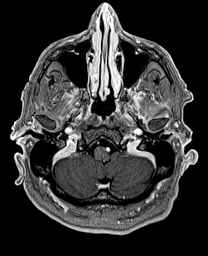
[im 40/160]
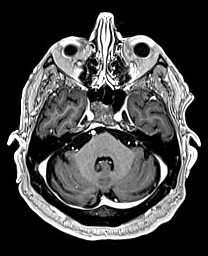
[im 60/160]
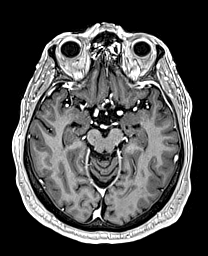
[im 80/160]
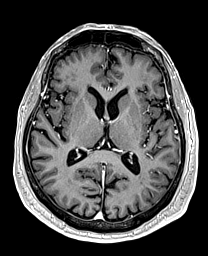
[im 100/160]
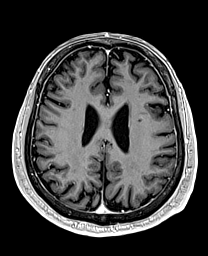
[im 120/160]
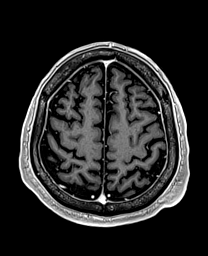
[im 140/160]
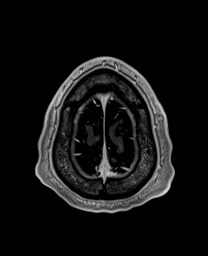
[im 160/160]
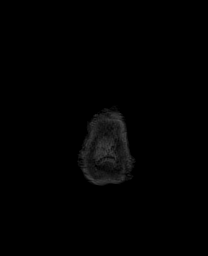

[Series 18: T1 · sagittal · 4.0mm · 0.94mm/px · 2 of 36 slices shown (3 of 4)]
[im 1/36]
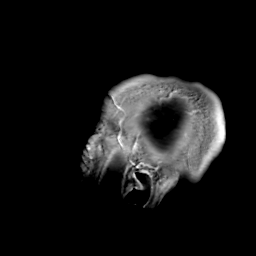
[im 36/36]
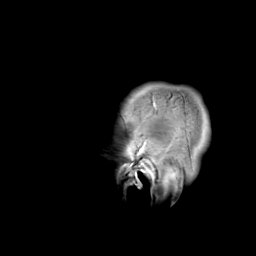

[Series 19: T1 · coronal · 4.0mm · 0.94mm/px · 3 of 47 slices shown (4 of 4)]
[im 1/47]
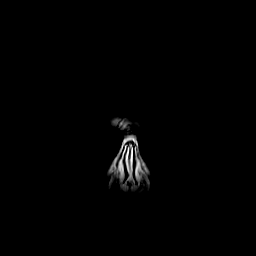
[im 24/47]
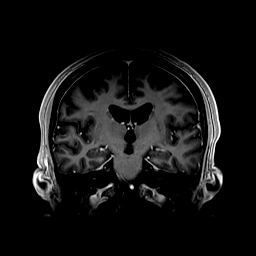
[im 47/47]
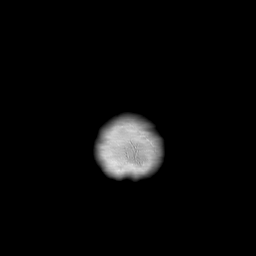

[48 of 48 positions shown; findings below may reference images not displayed]

FINDINGS: MRI HEAD FINDINGS

Brain: No restricted diffusion to suggest acute or subacute infarct.
No acute hemorrhage, mass, mass effect, or midline shift. T2
hyperintense signal in the periventricular white matter, likely the
sequela of chronic small vessel ischemic disease. Punctate foci of
hemosiderin deposition in the left parietal lobe, likely sequela of
prior microhemorrhages. No abnormal enhancement.

Vascular: Normal flow voids.

Skull and upper cervical spine: Normal marrow signal.

Other: The mastoids are well aerated.

MRI ORBITS FINDINGS

Orbits: No traumatic or inflammatory finding. Globes, optic nerves,
orbital fat, extraocular muscles, vascular structures, and lacrimal
glands are normal.

Visualized sinuses: Minimal mucosal thickening in the anterior
ethmoid air cells. Otherwise negative. The mastoids are well
aerated.

Soft tissues: Negative.
IMPRESSION: 1. No acute intracranial process.
2. No acute process in the orbits.

## 2021-05-25 IMAGING — CT CT HEAD W/O CM
4 series · 16 of 47 positions shown, 18 images · non-contrast
Comparison: None.

CLINICAL DATA: Headache, vision loss.

EXAM:
CT HEAD WITHOUT CONTRAST
TECHNIQUE: Contiguous axial images were obtained from the base of the skull
through the vertex without intravenous contrast.

[Series 2: head wo · axial · 0.47mm/px · z∈[-81,+39]mm · 7 of 34 slices shown, 9 images]
[im 5/34  brain]
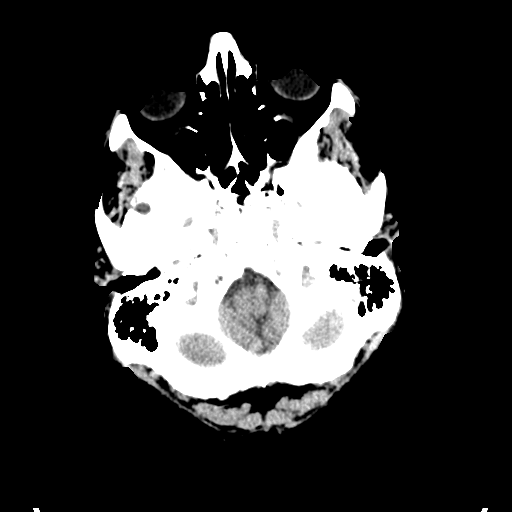
[im 5/34  bone]
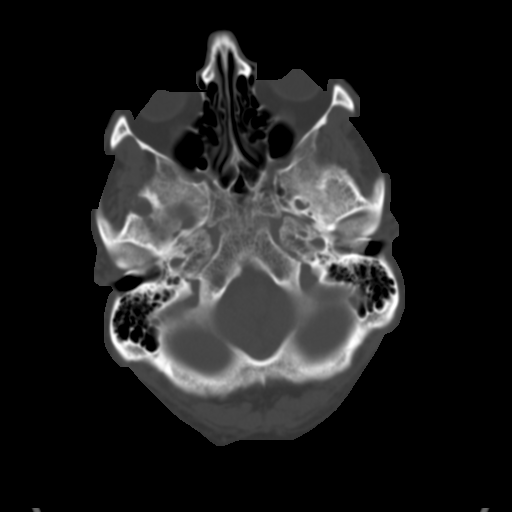
[im 9/34  brain]
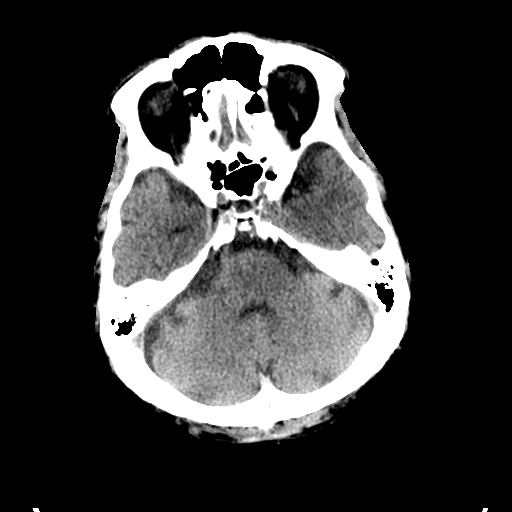
[im 13/34  brain]
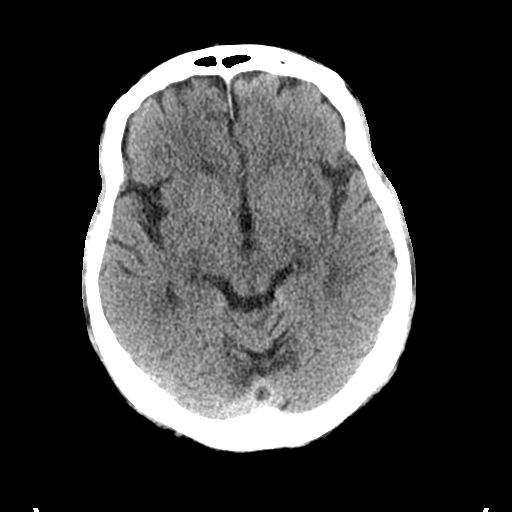
[im 17/34  brain]
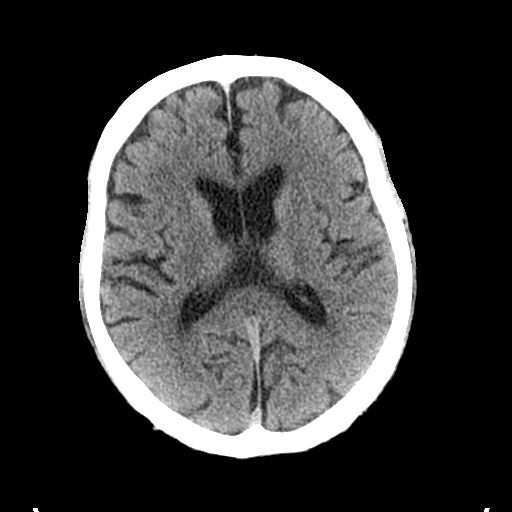
[im 21/34  brain]
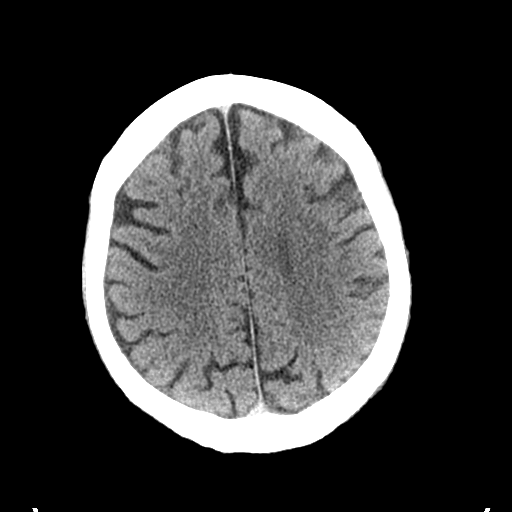
[im 21/34  bone]
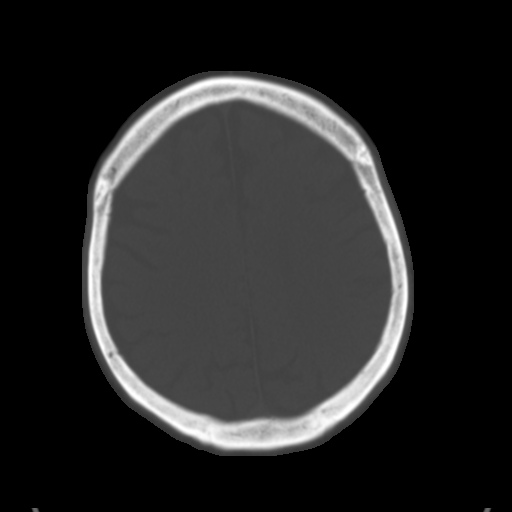
[im 25/34  brain]
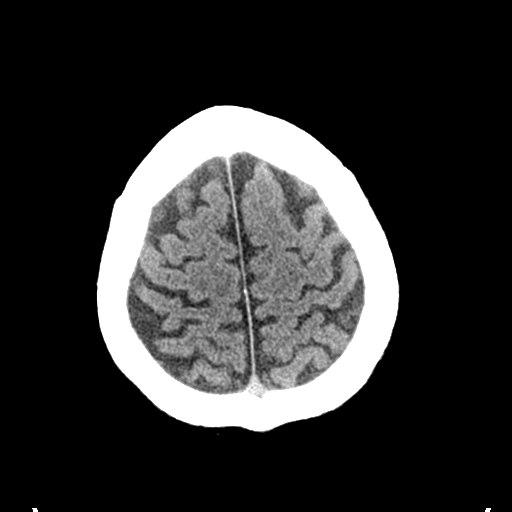
[im 29/34  brain]
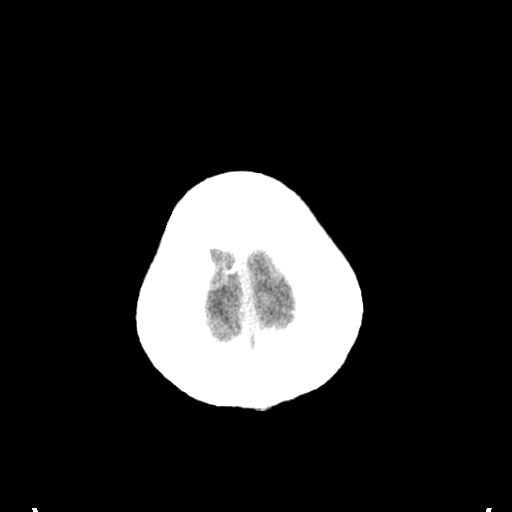

[Series 3: head bone · axial · 0.47mm/px · z∈[-85,-51]mm · 3 of 85 slices shown]
[im 9/85  bone]
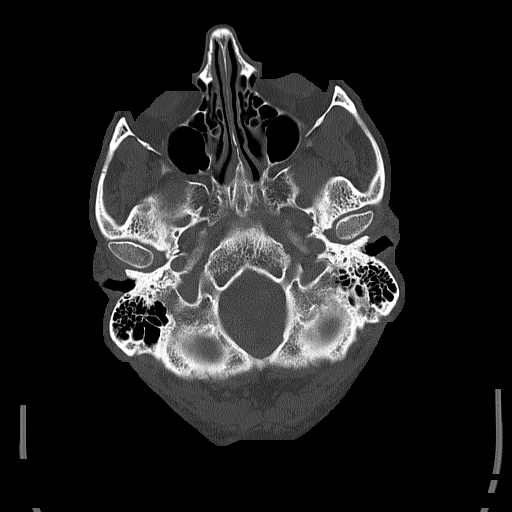
[im 17/85  bone]
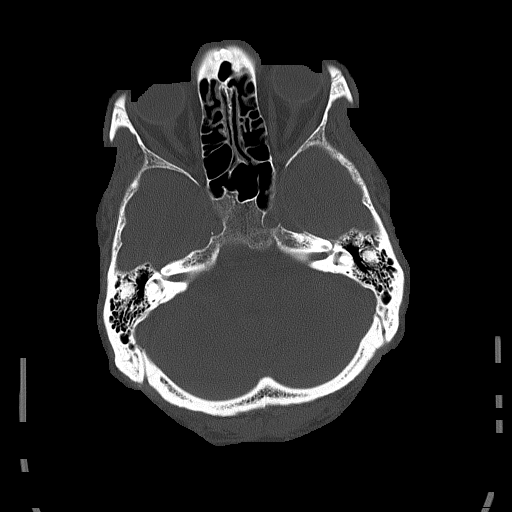
[im 26/85  bone]
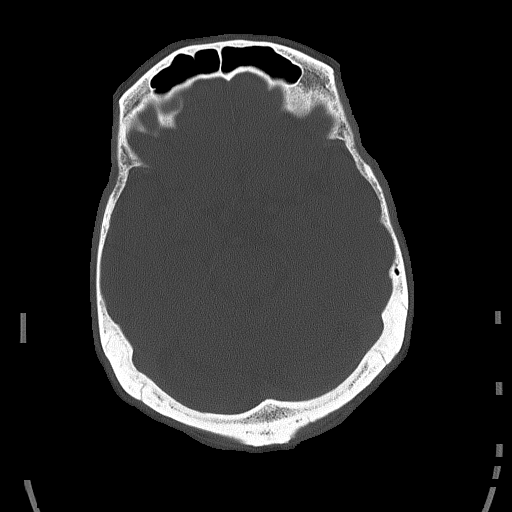

[Series 5: coronal soft tissue · coronal · 0.33mm/px · 3 of 74 slices shown]
[im 25/74  brain]
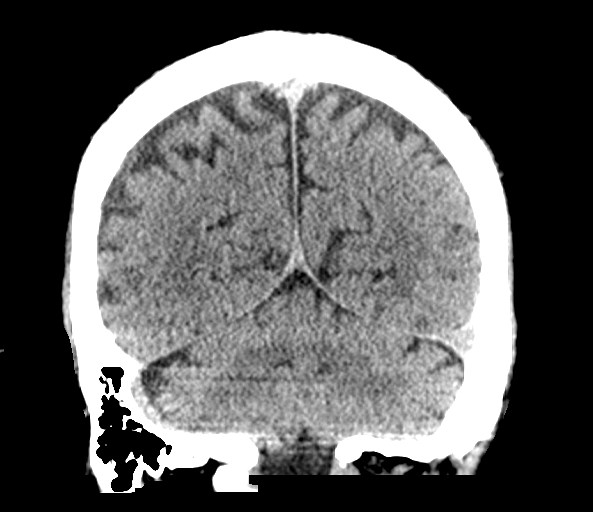
[im 33/74  brain]
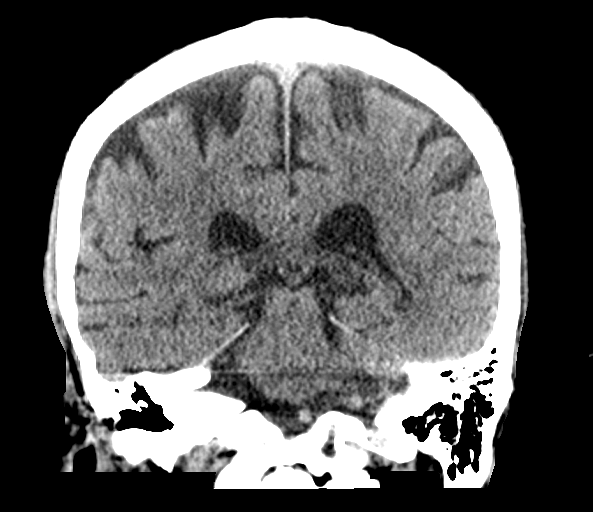
[im 41/74  brain]
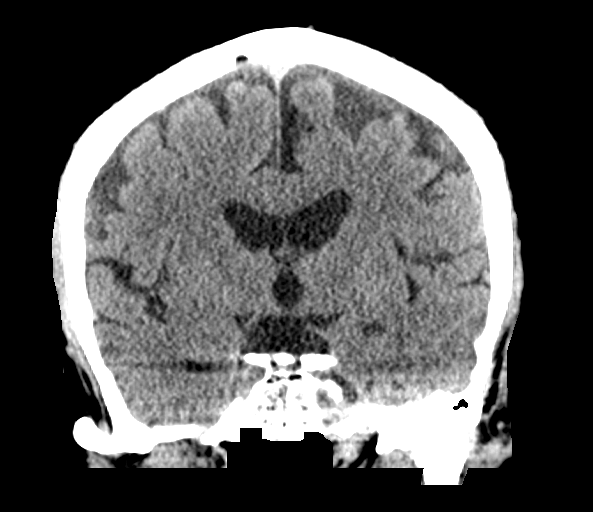

[Series 6: sagittal soft tissue · sagittal · 0.33mm/px · 3 of 67 slices shown]
[im 23/67  brain]
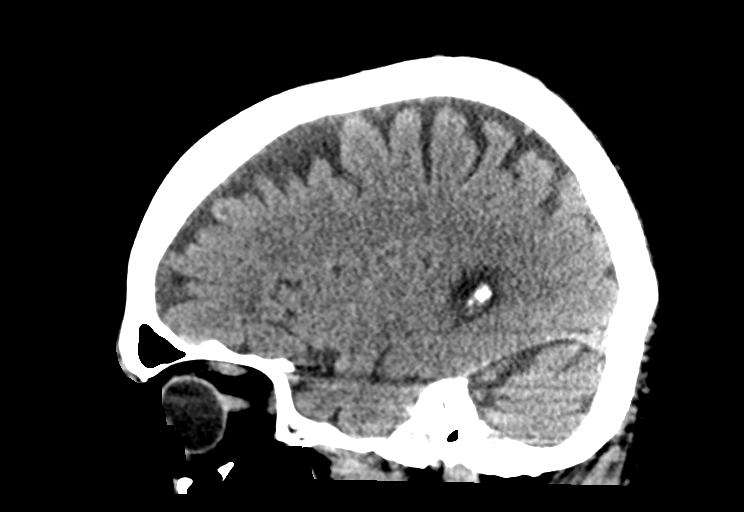
[im 34/67  brain]
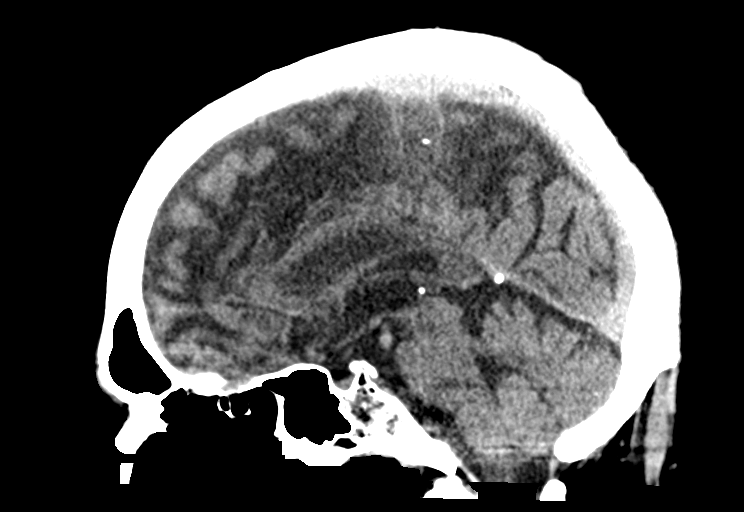
[im 45/67  brain]
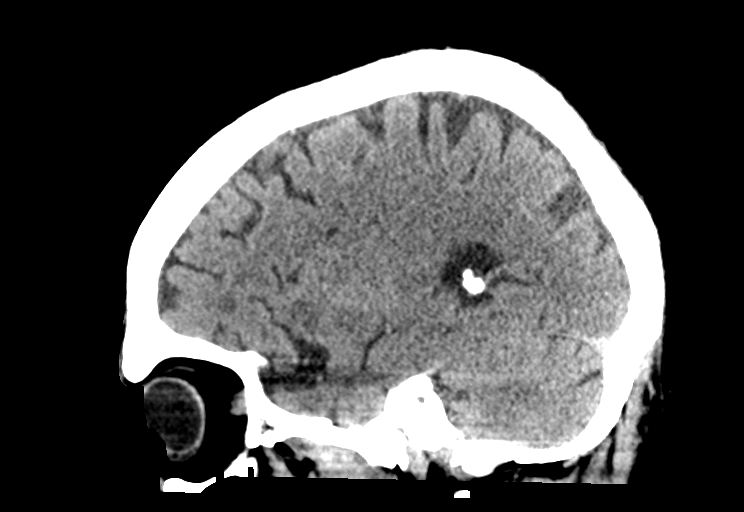

[16 of 47 positions shown; findings below may reference images not displayed]

FINDINGS: Brain: No evidence of acute infarction, hemorrhage, hydrocephalus,
extra-axial collection or mass lesion/mass effect.

Vascular: No hyperdense vessel or unexpected calcification.

Skull: Normal. Negative for fracture or focal lesion.

Sinuses/Orbits: No acute finding.

Other: None.
IMPRESSION: No acute intracranial abnormality seen.

## 2021-05-25 IMAGING — MR MR ORBITS WO/W CM
6 series · 45 of 48 positions shown · IV contrast (GADAVIST)
Comparison: No prior MRI, correlation is made with [DATE] CT
head

CLINICAL DATA: Severe headache yesterday with visual changes. Left
eye blurriness. Right eye diplopia, history of ocular migraines.

EXAM:
MRI HEAD AND ORBITS WITHOUT AND WITH CONTRAST
TECHNIQUE: Multiplanar, multiecho pulse sequences of the brain and surrounding
structures were obtained without and with intravenous contrast.
Multiplanar, multiecho pulse sequences of the orbits and surrounding
structures were obtained including fat saturation techniques, before
and after intravenous contrast administration.
CONTRAST:  10mL GADAVIST GADOBUTROL 1 MMOL/ML IV SOLN

[Series 1: T2 fat-sat · axial · 3.0mm · 0.47mm/px · z∈[-9,+52]mm · 6 of 20 slices shown (1 of 2)]
[im 1/20]
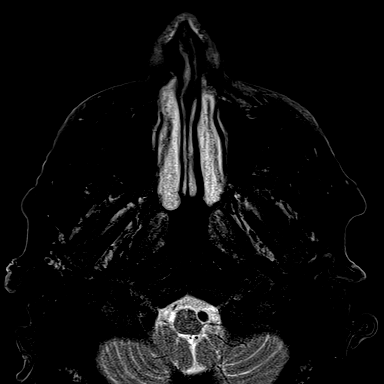
[im 4/20]
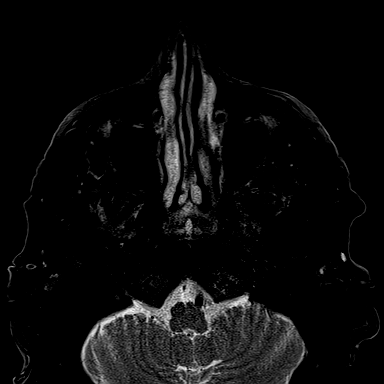
[im 8/20]
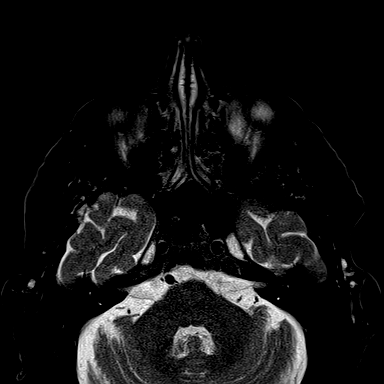
[im 12/20]
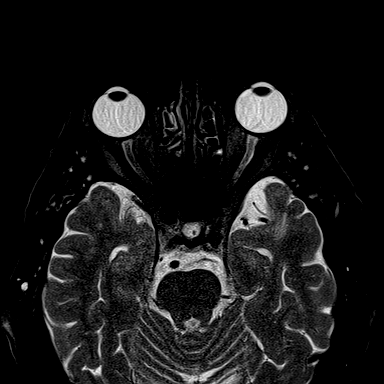
[im 16/20]
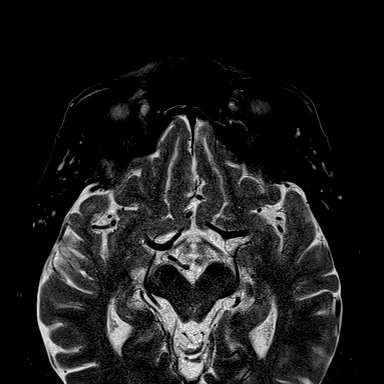
[im 20/20]
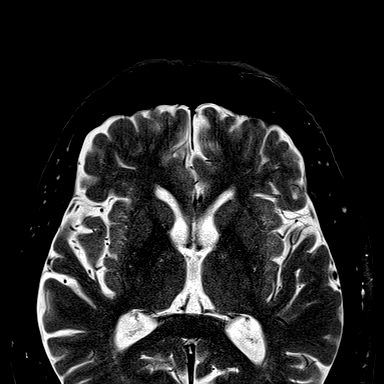

[Series 2: T1 · axial · 3.0mm · 0.56mm/px · z∈[-9,+52]mm · 6 of 20 slices shown (1 of 2)]
[im 1/20]
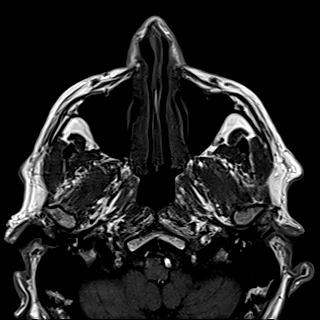
[im 4/20]
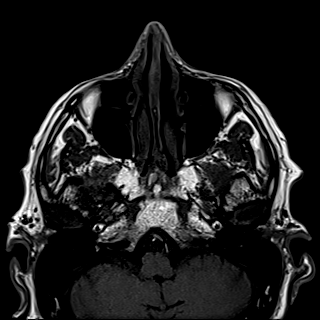
[im 8/20]
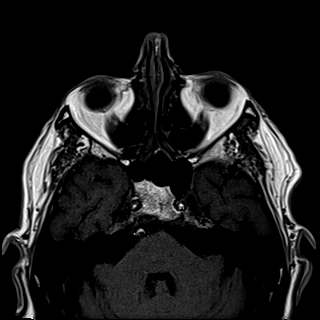
[im 12/20]
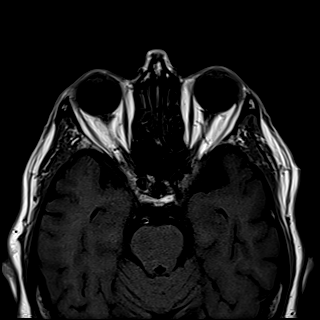
[im 16/20]
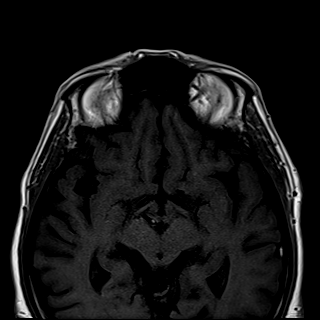
[im 20/20]
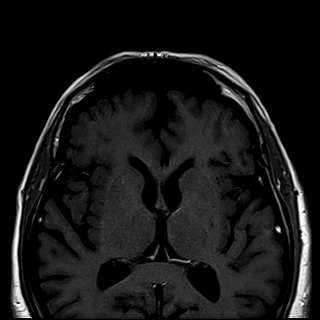

[Series 3: T2 fat-sat · coronal · 3.0mm · 0.56mm/px · 10 of 32 slices shown (2 of 2)]
[im 1/32]
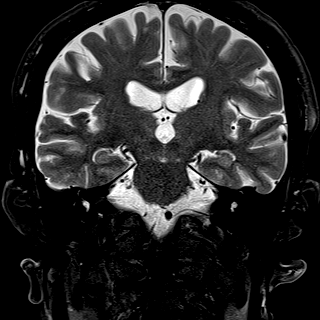
[im 4/32]
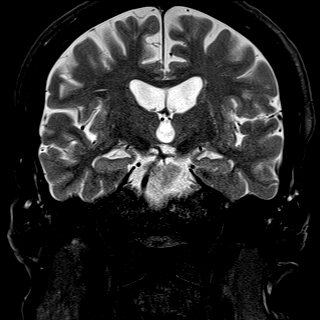
[im 7/32]
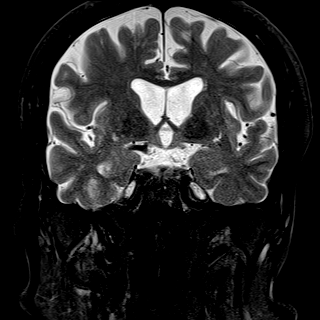
[im 11/32]
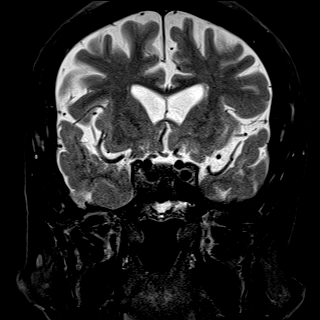
[im 14/32]
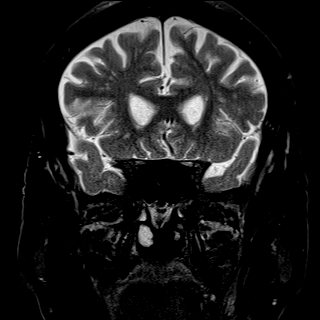
[im 18/32]
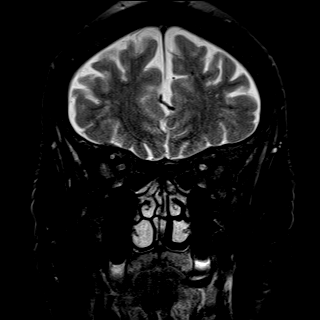
[im 21/32]
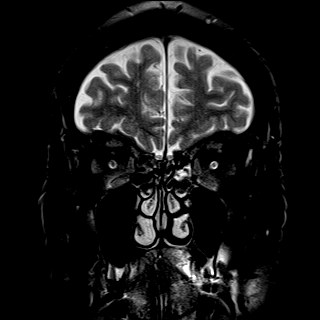
[im 25/32]
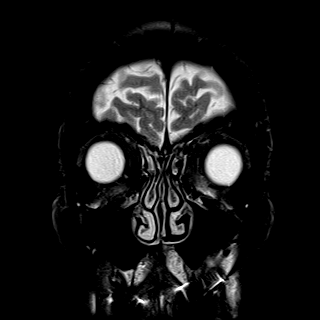
[im 28/32]
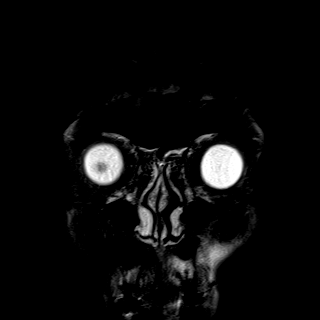
[im 32/32]
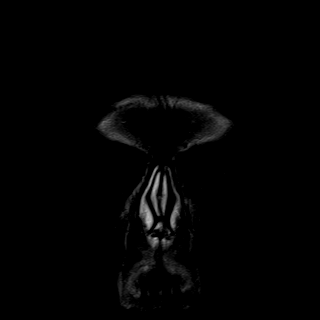

[Series 4: T1 · coronal · 3.0mm · 0.56mm/px · 9 of 32 slices shown (2 of 2)]
[im 1/32]
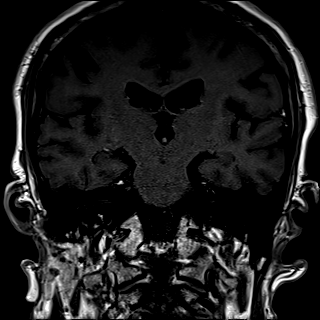
[im 4/32]
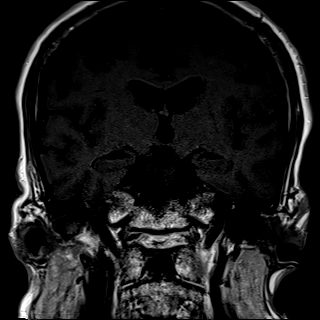
[im 7/32]
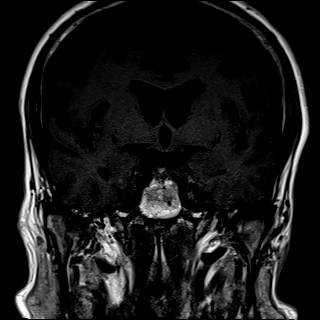
[im 11/32]
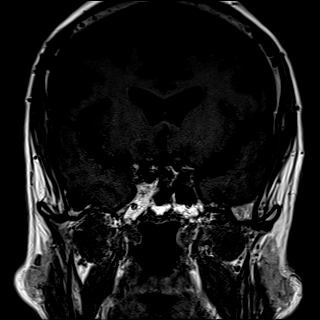
[im 14/32]
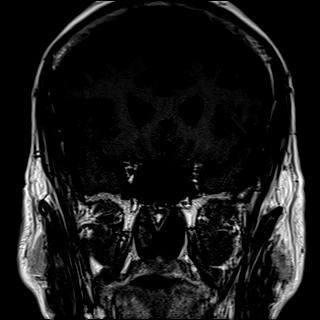
[im 18/32]
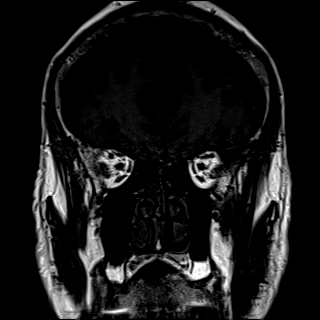
[im 21/32]
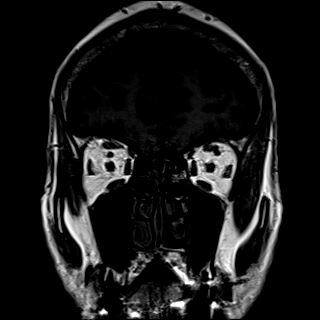
[im 28/32]
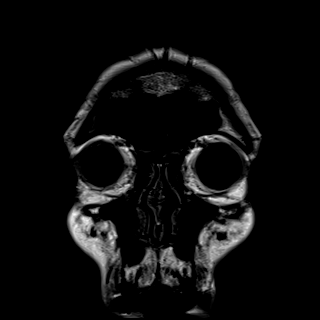
[im 32/32]
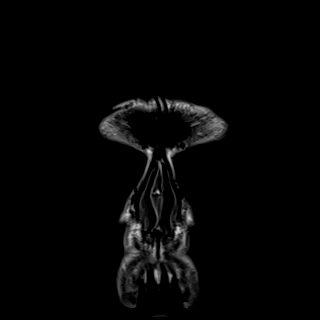

[Series 5: T1 fat-sat post-contrast · axial · 3.0mm · 0.56mm/px · z∈[-9,+52]mm · 6 of 20 slices shown (1 of 2)]
[im 1/20]
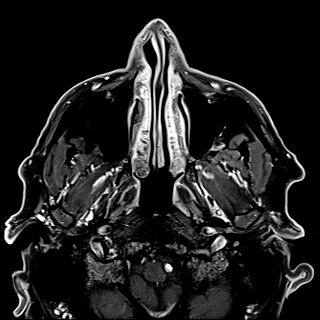
[im 4/20]
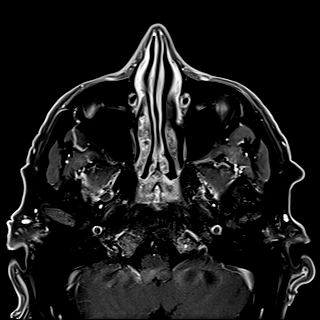
[im 8/20]
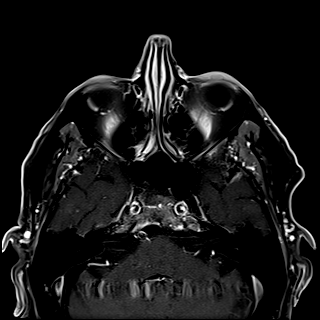
[im 12/20]
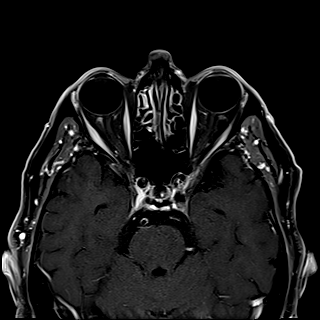
[im 16/20]
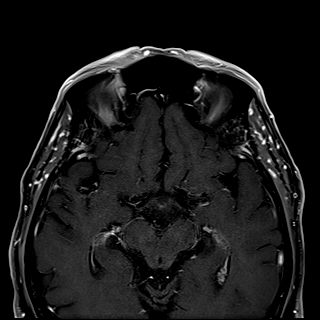
[im 20/20]
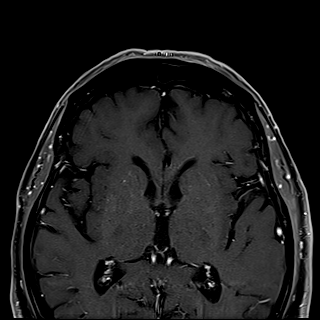

[Series 6: T1 fat-sat post-contrast · coronal · 3.0mm · 0.70mm/px · 8 of 32 slices shown (2 of 2)]
[im 1/32]
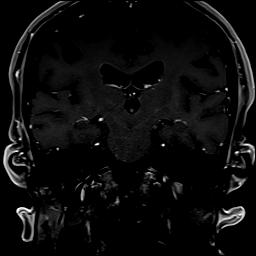
[im 4/32]
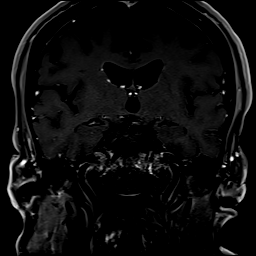
[im 11/32]
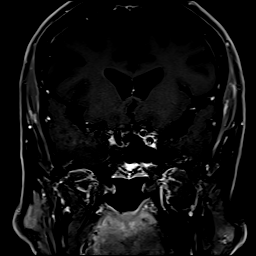
[im 14/32]
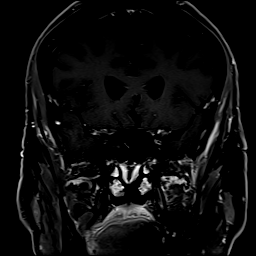
[im 18/32]
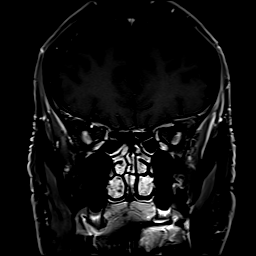
[im 21/32]
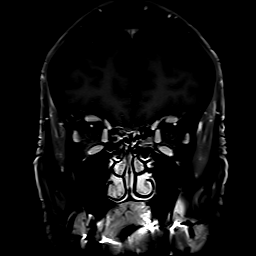
[im 28/32]
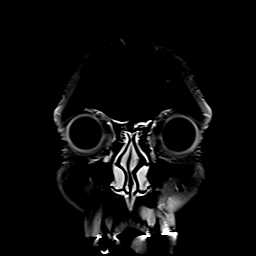
[im 32/32]
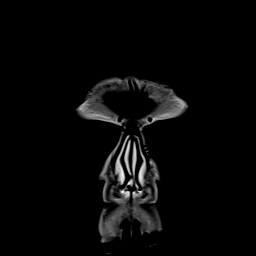

[45 of 48 positions shown; findings below may reference images not displayed]

FINDINGS: MRI HEAD FINDINGS

Brain: No restricted diffusion to suggest acute or subacute infarct.
No acute hemorrhage, mass, mass effect, or midline shift. T2
hyperintense signal in the periventricular white matter, likely the
sequela of chronic small vessel ischemic disease. Punctate foci of
hemosiderin deposition in the left parietal lobe, likely sequela of
prior microhemorrhages. No abnormal enhancement.

Vascular: Normal flow voids.

Skull and upper cervical spine: Normal marrow signal.

Other: The mastoids are well aerated.

MRI ORBITS FINDINGS

Orbits: No traumatic or inflammatory finding. Globes, optic nerves,
orbital fat, extraocular muscles, vascular structures, and lacrimal
glands are normal.

Visualized sinuses: Minimal mucosal thickening in the anterior
ethmoid air cells. Otherwise negative. The mastoids are well
aerated.

Soft tissues: Negative.
IMPRESSION: 1. No acute intracranial process.
2. No acute process in the orbits.

## 2021-05-25 MED ORDER — GADOBUTROL 1 MMOL/ML IV SOLN
10.0000 mL | Freq: Once | INTRAVENOUS | Status: AC | PRN
  Administered 2021-05-25: 10 mL via INTRAVENOUS

## 2021-05-25 NOTE — ED Provider Notes (Signed)
[facility] DEPT Provider Note   CSN: [id number] Arrival date & time: 05/25/21  1218     History Chief Complaint  Patient presents with   Diplopia    Jose Chapman is a 80 y.o. male.  80 year old male with prior medical history as detailed below presents for evaluation.  Patient reports onset of severe headache that he localized to behind his right eye.  Concurrent with onset of pain was reported diplopia.  Patient reports that his headache resolved after roughly 1 to 2 hours yesterday morning.  His diplopia has persisted.  He reports seeing double when he is gazes to the left.  Diplopia resolves with gaze to the right.  He denies associated slurred speech, weakness, continued headache, nausea, fever, chest pain, shortness of breath, or other complaint.  The history is provided by the patient.  Illness Location:  Headache, diplopia Severity:  Moderate Onset quality:  Sudden Duration:  1 day Timing:  Constant Progression:  Unchanged Chronicity:  New     History reviewed. No pertinent past medical history.  There are no problems to display for this patient.   Past Surgical History:  Procedure Laterality Date   HERNIA REPAIR         No family history on file.  Social History   Tobacco Use   Smoking status: Never  Vaping Use   Vaping Use: Never used  Substance Use Topics   Alcohol use: Not Currently   Drug use: Not Currently    Home Medications Prior to Admission medications   Medication Sig Start Date End Date Taking? Authorizing Provider  meloxicam (MOBIC) 15 MG tablet Take one tablet daily for 7 days then take as needed.  Take with food 05/22/16   Thurman Coyer, DO    Allergies    Patient has no known allergies.  Review of Systems   Review of Systems  All other systems reviewed and are negative.  Physical Exam Updated Vital Signs BP 130/83   Pulse (!) 53   Temp 97.6 F (36.4 C) (Oral)   Resp 14   SpO2 99%    Physical Exam Vitals and nursing note reviewed.  Constitutional:      General: He is not in acute distress.    Appearance: Normal appearance. He is well-developed.  HENT:     Head: Normocephalic and atraumatic.  Eyes:     Conjunctiva/sclera: Conjunctivae normal.     Pupils: Pupils are equal, round, and reactive to light.  Cardiovascular:     Rate and Rhythm: Normal rate and regular rhythm.     Heart sounds: Normal heart sounds.  Pulmonary:     Effort: Pulmonary effort is normal. No respiratory distress.     Breath sounds: Normal breath sounds.  Abdominal:     General: There is no distension.     Palpations: Abdomen is soft.     Tenderness: There is no abdominal tenderness.  Musculoskeletal:        General: No deformity. Normal range of motion.     Cervical back: Normal range of motion and neck supple.  Skin:    General: Skin is warm and dry.  Neurological:     General: No focal deficit present.     Mental Status: He is alert and oriented to person, place, and time. Mental status is at baseline.     Cranial Nerves: No cranial nerve deficit.     Sensory: No sensory deficit.     Motor: No weakness.  Coordination: Coordination normal.     Gait: Gait normal.     Comments: Diplopia present with leftward gaze, diplopia improved with rightward gaze.   Diplopia appears to resolve with covering of individual eyes.  Visual fields intact bilaterally.  Normal speech  Normal gait.   VAN negative  5/5 strength to BLE/BUE    ED Results / Procedures / Treatments   Labs (all labs ordered are listed, but only abnormal results are displayed) Labs Reviewed  COMPREHENSIVE METABOLIC PANEL - Abnormal; Notable for the following components:      Result Value   Total Bilirubin 1.3 (*)    All other components within normal limits  URINALYSIS, ROUTINE W REFLEX MICROSCOPIC - Abnormal; Notable for the following components:   Hgb urine dipstick TRACE (*)    All other components  within normal limits  RESP PANEL BY RT-PCR (FLU A&B, COVID) ARPGX2  CBC  DIFFERENTIAL  RAPID URINE DRUG SCREEN, HOSP PERFORMED  ETHANOL  PROTIME-INR  APTT  SEDIMENTATION RATE  C-REACTIVE PROTEIN    EKG EKG Interpretation  Date/Time:  Wednesday May 25 2021 13:38:25 EST Ventricular Rate:  55 PR Interval:  181 QRS Duration: 106 QT Interval:  433 QTC Calculation: 415 R Axis:   24 Text Interpretation: Sinus rhythm Ventricular premature complex Abnormal R-wave progression, early transition Confirmed by Dene Gentry [phone]) on 05/25/2021 3:11:48 PM  Radiology CT HEAD WO CONTRAST  Result Date: 05/25/2021 CLINICAL DATA:  Headache, vision loss. EXAM: CT HEAD WITHOUT CONTRAST TECHNIQUE: Contiguous axial images were obtained from the base of the skull through the vertex without intravenous contrast. COMPARISON:  None. FINDINGS: Brain: No evidence of acute infarction, hemorrhage, hydrocephalus, extra-axial collection or mass lesion/mass effect. Vascular: No hyperdense vessel or unexpected calcification. Skull: Normal. Negative for fracture or focal lesion. Sinuses/Orbits: No acute finding. Other: None. IMPRESSION: No acute intracranial abnormality seen. Electronically Signed   By: Marijo Conception M.D.   On: 05/25/2021 14:50    Procedures Procedures   Medications Ordered in ED Medications - No data to display  ED Course  I have reviewed the triage vital signs and the nursing notes.  Pertinent labs & imaging results that were available during my care of the patient were reviewed by me and considered in my medical decision making (see chart for details).    MDM Rules/Calculators/A&P                           MDM  MSE complete  Jose Chapman was evaluated in Emergency Department on 05/25/2021 for the symptoms described in the history of present illness. He was evaluated in the context of the global COVID-19 pandemic, which necessitated consideration that the patient might be at  risk for infection with the SARS-CoV-2 virus that causes COVID-19. Institutional protocols and algorithms that pertain to the evaluation of patients at risk for COVID-19 are in a state of rapid change based on information released by regulatory bodies including the CDC and federal and state organizations. These policies and algorithms were followed during the patient's care in the ED.  Patient presented with complaint of diplopia with onset yesterday morning.  Imaging without evidence of stroke or aneurysm.  ESR and CRP and other obtained labs without significant abnormality.  Case discussed with neurology, Dr. Lorrin Goodell. Neuro does not feel that additional inpatient workup required.  Patient seen by Dr. Wyatt Portela (Ophthalmology) who agrees with diagnosis of 6th Nerve palsy.   Patient understands need  for close follow-up with Dr. Katy Fitch and his PCP in the outpatient setting.  Patient is advised to take baby aspirin daily.  Patient is also advised to not drive until cleared by Dr. Katy Fitch.  Strict return precautions given and understood.  Final Clinical Impression(s) / ED Diagnoses Final diagnoses:  Diplopia  6th nerve palsy, right    Rx / DC Orders ED Discharge Orders     None        Valarie Merino, MD 05/25/21 2042

## 2021-05-25 NOTE — Discharge Instructions (Signed)
Return for any problem.   Follow up with DR. Wyatt Portela (Ophthalmology) as instructed.   Take a baby Aspirin (81mg ) daily - as instructed.   Do not drive until cleared by Dr. Katy Fitch.

## 2021-05-25 NOTE — ED Triage Notes (Signed)
Pt reports sudden onset severe HA yesterday accompanied by visual changes.  Per pt he has a right sided HA w/ left eye blurriness and right eye diplopia.  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  Pt spoke to PCP who encouraged him to come to the ED.

## 2021-05-25 NOTE — ED Notes (Signed)
Patient returned back from MRI at this time.  °

## 2021-05-25 NOTE — ED Notes (Signed)
Patient transported to MRI 

## 2021-05-25 NOTE — ED Provider Notes (Signed)
Emergency Medicine Provider Triage Evaluation Note  Jose Chapman , a 80 y.o. male  was evaluated in triage.  Pt complains of right sided headed that he had upon waking yesterday. Ha associated with blurred vision when looking with both eyes and blurred vision to the left eye. No other deficits reported.  Review of Systems  Positive: Headache, vision changes Negative: Numbness, weakness  Physical Exam  BP (!) 143/80 (BP Location: Right Arm)   Pulse (!) 56   Temp 97.6 F (36.4 C) (Oral)   Resp 16   SpO2 100%  Gen:   Awake, no distress   Resp:  Normal effort  MSK:   Moves extremities without difficulty  Other:  Clear speech, no facial droop, 5/5 strength to the bue/ble, normal sensation throughout, no neglect  Medical Decision Making  Medically screening exam initiated at 12:37 PM.  Appropriate orders placed.  Jose Chapman was informed that the remainder of the evaluation will be completed by another provider, this initial triage assessment does not replace that evaluation, and the importance of remaining in the ED until their evaluation is complete.     Rodney Booze, PA-C 05/25/21 1237    Sherwood Gambler, MD 05/26/21 [phone]

## 2021-05-26 DIAGNOSIS — H532 Diplopia: Secondary | ICD-10-CM | POA: Diagnosis not present

## 2021-06-01 DIAGNOSIS — H40023 Open angle with borderline findings, high risk, bilateral: Secondary | ICD-10-CM | POA: Diagnosis not present

## 2021-06-01 DIAGNOSIS — H2513 Age-related nuclear cataract, bilateral: Secondary | ICD-10-CM | POA: Diagnosis not present

## 2021-07-04 DIAGNOSIS — Z0000 Encounter for general adult medical examination without abnormal findings: Secondary | ICD-10-CM | POA: Diagnosis not present

## 2021-07-04 DIAGNOSIS — Z13220 Encounter for screening for lipoid disorders: Secondary | ICD-10-CM | POA: Diagnosis not present

## 2021-07-04 DIAGNOSIS — Z125 Encounter for screening for malignant neoplasm of prostate: Secondary | ICD-10-CM | POA: Diagnosis not present

## 2021-07-04 DIAGNOSIS — R7303 Prediabetes: Secondary | ICD-10-CM | POA: Diagnosis not present

## 2021-07-07 DIAGNOSIS — Z0000 Encounter for general adult medical examination without abnormal findings: Secondary | ICD-10-CM | POA: Diagnosis not present

## 2021-07-07 DIAGNOSIS — Z2821 Immunization not carried out because of patient refusal: Secondary | ICD-10-CM | POA: Diagnosis not present

## 2021-07-07 DIAGNOSIS — J309 Allergic rhinitis, unspecified: Secondary | ICD-10-CM | POA: Diagnosis not present

## 2021-07-07 DIAGNOSIS — B351 Tinea unguium: Secondary | ICD-10-CM | POA: Diagnosis not present

## 2021-07-07 DIAGNOSIS — R7303 Prediabetes: Secondary | ICD-10-CM | POA: Diagnosis not present

## 2021-07-15 DIAGNOSIS — H401133 Primary open-angle glaucoma, bilateral, severe stage: Secondary | ICD-10-CM | POA: Diagnosis not present

## 2021-07-15 DIAGNOSIS — H2513 Age-related nuclear cataract, bilateral: Secondary | ICD-10-CM | POA: Diagnosis not present

## 2021-09-01 DIAGNOSIS — H2512 Age-related nuclear cataract, left eye: Secondary | ICD-10-CM | POA: Diagnosis not present

## 2021-09-01 DIAGNOSIS — H401123 Primary open-angle glaucoma, left eye, severe stage: Secondary | ICD-10-CM | POA: Diagnosis not present

## 2021-11-29 DIAGNOSIS — D485 Neoplasm of uncertain behavior of skin: Secondary | ICD-10-CM | POA: Diagnosis not present

## 2021-11-29 DIAGNOSIS — Z85828 Personal history of other malignant neoplasm of skin: Secondary | ICD-10-CM | POA: Diagnosis not present

## 2021-11-29 DIAGNOSIS — D225 Melanocytic nevi of trunk: Secondary | ICD-10-CM | POA: Diagnosis not present

## 2021-11-29 DIAGNOSIS — D2272 Melanocytic nevi of left lower limb, including hip: Secondary | ICD-10-CM | POA: Diagnosis not present

## 2021-11-29 DIAGNOSIS — C44629 Squamous cell carcinoma of skin of left upper limb, including shoulder: Secondary | ICD-10-CM | POA: Diagnosis not present

## 2021-11-29 DIAGNOSIS — L578 Other skin changes due to chronic exposure to nonionizing radiation: Secondary | ICD-10-CM | POA: Diagnosis not present

## 2021-11-29 DIAGNOSIS — L570 Actinic keratosis: Secondary | ICD-10-CM | POA: Diagnosis not present

## 2021-11-29 DIAGNOSIS — D1730 Benign lipomatous neoplasm of skin and subcutaneous tissue of unspecified sites: Secondary | ICD-10-CM | POA: Diagnosis not present

## 2021-11-29 DIAGNOSIS — L219 Seborrheic dermatitis, unspecified: Secondary | ICD-10-CM | POA: Diagnosis not present

## 2021-11-29 DIAGNOSIS — B359 Dermatophytosis, unspecified: Secondary | ICD-10-CM | POA: Diagnosis not present

## 2021-11-29 DIAGNOSIS — L821 Other seborrheic keratosis: Secondary | ICD-10-CM | POA: Diagnosis not present

## 2022-01-23 DIAGNOSIS — H401133 Primary open-angle glaucoma, bilateral, severe stage: Secondary | ICD-10-CM | POA: Diagnosis not present

## 2022-01-23 DIAGNOSIS — Z961 Presence of intraocular lens: Secondary | ICD-10-CM | POA: Diagnosis not present

## 2022-01-23 DIAGNOSIS — H2511 Age-related nuclear cataract, right eye: Secondary | ICD-10-CM | POA: Diagnosis not present

## 2022-01-23 DIAGNOSIS — H04123 Dry eye syndrome of bilateral lacrimal glands: Secondary | ICD-10-CM | POA: Diagnosis not present

## 2022-02-07 DIAGNOSIS — C44629 Squamous cell carcinoma of skin of left upper limb, including shoulder: Secondary | ICD-10-CM | POA: Diagnosis not present

## 2022-02-07 DIAGNOSIS — L814 Other melanin hyperpigmentation: Secondary | ICD-10-CM | POA: Diagnosis not present

## 2022-02-07 DIAGNOSIS — D485 Neoplasm of uncertain behavior of skin: Secondary | ICD-10-CM | POA: Diagnosis not present

## 2022-02-07 DIAGNOSIS — L988 Other specified disorders of the skin and subcutaneous tissue: Secondary | ICD-10-CM | POA: Diagnosis not present

## 2022-04-20 DIAGNOSIS — D2272 Melanocytic nevi of left lower limb, including hip: Secondary | ICD-10-CM | POA: Diagnosis not present

## 2022-04-20 DIAGNOSIS — L570 Actinic keratosis: Secondary | ICD-10-CM | POA: Diagnosis not present

## 2022-04-20 DIAGNOSIS — D1721 Benign lipomatous neoplasm of skin and subcutaneous tissue of right arm: Secondary | ICD-10-CM | POA: Diagnosis not present

## 2022-04-20 DIAGNOSIS — L918 Other hypertrophic disorders of the skin: Secondary | ICD-10-CM | POA: Diagnosis not present

## 2022-04-20 DIAGNOSIS — L821 Other seborrheic keratosis: Secondary | ICD-10-CM | POA: Diagnosis not present

## 2022-04-20 DIAGNOSIS — L578 Other skin changes due to chronic exposure to nonionizing radiation: Secondary | ICD-10-CM | POA: Diagnosis not present

## 2022-04-20 DIAGNOSIS — Z85828 Personal history of other malignant neoplasm of skin: Secondary | ICD-10-CM | POA: Diagnosis not present

## 2022-04-20 DIAGNOSIS — D225 Melanocytic nevi of trunk: Secondary | ICD-10-CM | POA: Diagnosis not present

## 2022-04-20 DIAGNOSIS — D485 Neoplasm of uncertain behavior of skin: Secondary | ICD-10-CM | POA: Diagnosis not present

## 2022-06-02 DIAGNOSIS — H2511 Age-related nuclear cataract, right eye: Secondary | ICD-10-CM | POA: Diagnosis not present

## 2022-06-02 DIAGNOSIS — H04123 Dry eye syndrome of bilateral lacrimal glands: Secondary | ICD-10-CM | POA: Diagnosis not present

## 2022-06-02 DIAGNOSIS — Z961 Presence of intraocular lens: Secondary | ICD-10-CM | POA: Diagnosis not present

## 2022-06-02 DIAGNOSIS — H401133 Primary open-angle glaucoma, bilateral, severe stage: Secondary | ICD-10-CM | POA: Diagnosis not present

## 2022-06-02 DIAGNOSIS — H40031 Anatomical narrow angle, right eye: Secondary | ICD-10-CM | POA: Diagnosis not present

## 2022-07-10 DIAGNOSIS — Z125 Encounter for screening for malignant neoplasm of prostate: Secondary | ICD-10-CM | POA: Diagnosis not present

## 2022-07-10 DIAGNOSIS — R7303 Prediabetes: Secondary | ICD-10-CM | POA: Diagnosis not present

## 2022-07-13 DIAGNOSIS — H8113 Benign paroxysmal vertigo, bilateral: Secondary | ICD-10-CM | POA: Diagnosis not present

## 2022-07-13 DIAGNOSIS — R7303 Prediabetes: Secondary | ICD-10-CM | POA: Diagnosis not present

## 2022-07-13 DIAGNOSIS — B351 Tinea unguium: Secondary | ICD-10-CM | POA: Diagnosis not present

## 2022-07-13 DIAGNOSIS — Z0000 Encounter for general adult medical examination without abnormal findings: Secondary | ICD-10-CM | POA: Diagnosis not present

## 2022-07-13 DIAGNOSIS — J309 Allergic rhinitis, unspecified: Secondary | ICD-10-CM | POA: Diagnosis not present

## 2022-07-13 DIAGNOSIS — Z2821 Immunization not carried out because of patient refusal: Secondary | ICD-10-CM | POA: Diagnosis not present

## 2022-07-17 DIAGNOSIS — R42 Dizziness and giddiness: Secondary | ICD-10-CM | POA: Diagnosis not present

## 2022-10-13 DIAGNOSIS — H04123 Dry eye syndrome of bilateral lacrimal glands: Secondary | ICD-10-CM | POA: Diagnosis not present

## 2022-10-13 DIAGNOSIS — H40031 Anatomical narrow angle, right eye: Secondary | ICD-10-CM | POA: Diagnosis not present

## 2022-10-13 DIAGNOSIS — H2511 Age-related nuclear cataract, right eye: Secondary | ICD-10-CM | POA: Diagnosis not present

## 2022-10-13 DIAGNOSIS — H401133 Primary open-angle glaucoma, bilateral, severe stage: Secondary | ICD-10-CM | POA: Diagnosis not present

## 2022-10-13 DIAGNOSIS — Z961 Presence of intraocular lens: Secondary | ICD-10-CM | POA: Diagnosis not present

## 2023-03-12 DIAGNOSIS — Z961 Presence of intraocular lens: Secondary | ICD-10-CM | POA: Diagnosis not present

## 2023-03-12 DIAGNOSIS — H2511 Age-related nuclear cataract, right eye: Secondary | ICD-10-CM | POA: Diagnosis not present

## 2023-03-12 DIAGNOSIS — H401133 Primary open-angle glaucoma, bilateral, severe stage: Secondary | ICD-10-CM | POA: Diagnosis not present

## 2023-03-12 DIAGNOSIS — H04123 Dry eye syndrome of bilateral lacrimal glands: Secondary | ICD-10-CM | POA: Diagnosis not present

## 2023-03-12 DIAGNOSIS — H40031 Anatomical narrow angle, right eye: Secondary | ICD-10-CM | POA: Diagnosis not present

## 2023-04-24 DIAGNOSIS — L821 Other seborrheic keratosis: Secondary | ICD-10-CM | POA: Diagnosis not present

## 2023-04-24 DIAGNOSIS — D1721 Benign lipomatous neoplasm of skin and subcutaneous tissue of right arm: Secondary | ICD-10-CM | POA: Diagnosis not present

## 2023-04-24 DIAGNOSIS — L578 Other skin changes due to chronic exposure to nonionizing radiation: Secondary | ICD-10-CM | POA: Diagnosis not present

## 2023-04-24 DIAGNOSIS — B353 Tinea pedis: Secondary | ICD-10-CM | POA: Diagnosis not present

## 2023-04-24 DIAGNOSIS — Z85828 Personal history of other malignant neoplasm of skin: Secondary | ICD-10-CM | POA: Diagnosis not present

## 2023-04-24 DIAGNOSIS — D2272 Melanocytic nevi of left lower limb, including hip: Secondary | ICD-10-CM | POA: Diagnosis not present

## 2023-04-24 DIAGNOSIS — L219 Seborrheic dermatitis, unspecified: Secondary | ICD-10-CM | POA: Diagnosis not present

## 2023-04-24 DIAGNOSIS — L918 Other hypertrophic disorders of the skin: Secondary | ICD-10-CM | POA: Diagnosis not present

## 2023-04-24 DIAGNOSIS — L570 Actinic keratosis: Secondary | ICD-10-CM | POA: Diagnosis not present

## 2023-04-24 DIAGNOSIS — D225 Melanocytic nevi of trunk: Secondary | ICD-10-CM | POA: Diagnosis not present

## 2023-04-24 DIAGNOSIS — L304 Erythema intertrigo: Secondary | ICD-10-CM | POA: Diagnosis not present

## 2023-07-10 DIAGNOSIS — H2511 Age-related nuclear cataract, right eye: Secondary | ICD-10-CM | POA: Diagnosis not present

## 2023-07-10 DIAGNOSIS — Z961 Presence of intraocular lens: Secondary | ICD-10-CM | POA: Diagnosis not present

## 2023-07-10 DIAGNOSIS — H40031 Anatomical narrow angle, right eye: Secondary | ICD-10-CM | POA: Diagnosis not present

## 2023-07-10 DIAGNOSIS — H04123 Dry eye syndrome of bilateral lacrimal glands: Secondary | ICD-10-CM | POA: Diagnosis not present

## 2023-07-10 DIAGNOSIS — H401133 Primary open-angle glaucoma, bilateral, severe stage: Secondary | ICD-10-CM | POA: Diagnosis not present

## 2023-07-12 DIAGNOSIS — Z13220 Encounter for screening for lipoid disorders: Secondary | ICD-10-CM | POA: Diagnosis not present

## 2023-07-12 DIAGNOSIS — R7303 Prediabetes: Secondary | ICD-10-CM | POA: Diagnosis not present

## 2023-07-16 DIAGNOSIS — Z0000 Encounter for general adult medical examination without abnormal findings: Secondary | ICD-10-CM | POA: Diagnosis not present

## 2023-07-16 DIAGNOSIS — J309 Allergic rhinitis, unspecified: Secondary | ICD-10-CM | POA: Diagnosis not present

## 2023-07-16 DIAGNOSIS — R7303 Prediabetes: Secondary | ICD-10-CM | POA: Diagnosis not present

## 2023-07-16 DIAGNOSIS — N4340 Spermatocele of epididymis, unspecified: Secondary | ICD-10-CM | POA: Diagnosis not present

## 2023-08-27 DIAGNOSIS — R0981 Nasal congestion: Secondary | ICD-10-CM | POA: Diagnosis not present

## 2023-08-27 DIAGNOSIS — R059 Cough, unspecified: Secondary | ICD-10-CM | POA: Diagnosis not present

## 2023-12-18 DIAGNOSIS — H40031 Anatomical narrow angle, right eye: Secondary | ICD-10-CM | POA: Diagnosis not present

## 2023-12-18 DIAGNOSIS — Z961 Presence of intraocular lens: Secondary | ICD-10-CM | POA: Diagnosis not present

## 2023-12-18 DIAGNOSIS — H2511 Age-related nuclear cataract, right eye: Secondary | ICD-10-CM | POA: Diagnosis not present

## 2023-12-18 DIAGNOSIS — H04123 Dry eye syndrome of bilateral lacrimal glands: Secondary | ICD-10-CM | POA: Diagnosis not present

## 2023-12-18 DIAGNOSIS — H401133 Primary open-angle glaucoma, bilateral, severe stage: Secondary | ICD-10-CM | POA: Diagnosis not present

## 2024-04-22 DIAGNOSIS — Z961 Presence of intraocular lens: Secondary | ICD-10-CM | POA: Diagnosis not present

## 2024-04-22 DIAGNOSIS — H2511 Age-related nuclear cataract, right eye: Secondary | ICD-10-CM | POA: Diagnosis not present

## 2024-04-22 DIAGNOSIS — H401133 Primary open-angle glaucoma, bilateral, severe stage: Secondary | ICD-10-CM | POA: Diagnosis not present

## 2024-04-22 DIAGNOSIS — H40031 Anatomical narrow angle, right eye: Secondary | ICD-10-CM | POA: Diagnosis not present

## 2024-04-22 DIAGNOSIS — H04123 Dry eye syndrome of bilateral lacrimal glands: Secondary | ICD-10-CM | POA: Diagnosis not present

## 2024-04-23 DIAGNOSIS — L570 Actinic keratosis: Secondary | ICD-10-CM | POA: Diagnosis not present

## 2024-04-23 DIAGNOSIS — D2272 Melanocytic nevi of left lower limb, including hip: Secondary | ICD-10-CM | POA: Diagnosis not present

## 2024-04-23 DIAGNOSIS — L578 Other skin changes due to chronic exposure to nonionizing radiation: Secondary | ICD-10-CM | POA: Diagnosis not present

## 2024-04-23 DIAGNOSIS — L219 Seborrheic dermatitis, unspecified: Secondary | ICD-10-CM | POA: Diagnosis not present

## 2024-04-23 DIAGNOSIS — D225 Melanocytic nevi of trunk: Secondary | ICD-10-CM | POA: Diagnosis not present

## 2024-04-23 DIAGNOSIS — L821 Other seborrheic keratosis: Secondary | ICD-10-CM | POA: Diagnosis not present

## 2024-04-23 DIAGNOSIS — Z85828 Personal history of other malignant neoplasm of skin: Secondary | ICD-10-CM | POA: Diagnosis not present

## 2024-04-23 DIAGNOSIS — D1721 Benign lipomatous neoplasm of skin and subcutaneous tissue of right arm: Secondary | ICD-10-CM | POA: Diagnosis not present

## 2024-04-23 DIAGNOSIS — L918 Other hypertrophic disorders of the skin: Secondary | ICD-10-CM | POA: Diagnosis not present

## 2024-04-25 DIAGNOSIS — H6120 Impacted cerumen, unspecified ear: Secondary | ICD-10-CM | POA: Diagnosis not present

## 2024-04-25 DIAGNOSIS — R110 Nausea: Secondary | ICD-10-CM | POA: Diagnosis not present

## 2024-04-25 DIAGNOSIS — R42 Dizziness and giddiness: Secondary | ICD-10-CM | POA: Diagnosis not present
